# Patient Record
Sex: Male | Born: 1955 | Race: Black or African American | Hispanic: No | Smoking: Current every day smoker
Health system: Southern US, Community
[De-identification: ages and names within clinical notes are randomized; demographics above are authoritative.]

## PROBLEM LIST (undated history)

## (undated) ENCOUNTER — Emergency Department (HOSPITAL_COMMUNITY): Admission: EM

## (undated) DIAGNOSIS — F1011 Alcohol abuse, in remission: Secondary | ICD-10-CM

## (undated) DIAGNOSIS — I10 Essential (primary) hypertension: Secondary | ICD-10-CM

## (undated) DIAGNOSIS — R001 Bradycardia, unspecified: Secondary | ICD-10-CM

## (undated) DIAGNOSIS — Z87898 Personal history of other specified conditions: Secondary | ICD-10-CM

## (undated) DIAGNOSIS — R51 Headache: Secondary | ICD-10-CM

## (undated) DIAGNOSIS — R569 Unspecified convulsions: Secondary | ICD-10-CM

## (undated) DIAGNOSIS — I429 Cardiomyopathy, unspecified: Secondary | ICD-10-CM

## (undated) DIAGNOSIS — I471 Supraventricular tachycardia, unspecified: Secondary | ICD-10-CM

## (undated) DIAGNOSIS — G8929 Other chronic pain: Secondary | ICD-10-CM

## (undated) DIAGNOSIS — B182 Chronic viral hepatitis C: Secondary | ICD-10-CM

## (undated) HISTORY — DX: Supraventricular tachycardia, unspecified: I47.10

## (undated) HISTORY — DX: Supraventricular tachycardia: I47.1

## (undated) HISTORY — DX: Cardiomyopathy, unspecified: I42.9

## (undated) HISTORY — DX: Essential (primary) hypertension: I10

## (undated) HISTORY — DX: Bradycardia, unspecified: R00.1

## (undated) HISTORY — DX: Chronic viral hepatitis C: B18.2

## (undated) HISTORY — DX: Personal history of other specified conditions: Z87.898

## (undated) HISTORY — DX: Alcohol abuse, in remission: F10.11

---

## 2002-03-06 ENCOUNTER — Emergency Department (HOSPITAL_COMMUNITY): Admission: EM | Admit: 2002-03-06 | Discharge: 2002-03-06 | Payer: Self-pay | Admitting: Emergency Medicine

## 2002-03-07 ENCOUNTER — Encounter (HOSPITAL_COMMUNITY): Admission: RE | Admit: 2002-03-07 | Discharge: 2002-04-06 | Payer: Self-pay | Admitting: Family Medicine

## 2002-07-21 ENCOUNTER — Emergency Department (HOSPITAL_COMMUNITY): Admission: EM | Admit: 2002-07-21 | Discharge: 2002-07-21 | Payer: Self-pay | Admitting: Emergency Medicine

## 2002-11-13 ENCOUNTER — Encounter: Payer: Self-pay | Admitting: Emergency Medicine

## 2002-11-13 ENCOUNTER — Emergency Department (HOSPITAL_COMMUNITY): Admission: EM | Admit: 2002-11-13 | Discharge: 2002-11-13 | Payer: Self-pay | Admitting: Emergency Medicine

## 2003-12-19 ENCOUNTER — Emergency Department (HOSPITAL_COMMUNITY): Admission: EM | Admit: 2003-12-19 | Discharge: 2003-12-20 | Payer: Self-pay | Admitting: *Deleted

## 2005-02-20 ENCOUNTER — Emergency Department (HOSPITAL_COMMUNITY): Admission: EM | Admit: 2005-02-20 | Discharge: 2005-02-21 | Payer: Self-pay | Admitting: *Deleted

## 2005-11-10 ENCOUNTER — Emergency Department (HOSPITAL_COMMUNITY): Admission: EM | Admit: 2005-11-10 | Discharge: 2005-11-10 | Payer: Self-pay | Admitting: Emergency Medicine

## 2007-06-04 ENCOUNTER — Emergency Department (HOSPITAL_COMMUNITY): Admission: EM | Admit: 2007-06-04 | Discharge: 2007-06-04 | Payer: Self-pay | Admitting: Emergency Medicine

## 2009-03-14 ENCOUNTER — Encounter: Payer: Self-pay | Admitting: Gastroenterology

## 2009-03-28 DIAGNOSIS — I2510 Atherosclerotic heart disease of native coronary artery without angina pectoris: Secondary | ICD-10-CM | POA: Insufficient documentation

## 2009-03-28 DIAGNOSIS — I7389 Other specified peripheral vascular diseases: Secondary | ICD-10-CM

## 2009-03-28 DIAGNOSIS — K922 Gastrointestinal hemorrhage, unspecified: Secondary | ICD-10-CM | POA: Insufficient documentation

## 2009-03-28 DIAGNOSIS — I679 Cerebrovascular disease, unspecified: Secondary | ICD-10-CM

## 2009-03-28 DIAGNOSIS — E785 Hyperlipidemia, unspecified: Secondary | ICD-10-CM

## 2009-07-28 HISTORY — PX: OTHER SURGICAL HISTORY: SHX169

## 2009-08-25 ENCOUNTER — Emergency Department (HOSPITAL_COMMUNITY): Admission: EM | Admit: 2009-08-25 | Discharge: 2009-08-25 | Payer: Self-pay | Admitting: Emergency Medicine

## 2009-12-13 ENCOUNTER — Inpatient Hospital Stay (HOSPITAL_COMMUNITY)
Admission: EM | Admit: 2009-12-13 | Discharge: 2009-12-17 | Payer: Self-pay | Source: Home / Self Care | Admitting: Emergency Medicine

## 2009-12-14 ENCOUNTER — Ambulatory Visit: Payer: Self-pay | Admitting: Orthopedic Surgery

## 2009-12-15 ENCOUNTER — Encounter: Payer: Self-pay | Admitting: Orthopedic Surgery

## 2009-12-31 ENCOUNTER — Ambulatory Visit: Payer: Self-pay | Admitting: Orthopedic Surgery

## 2009-12-31 DIAGNOSIS — L02519 Cutaneous abscess of unspecified hand: Secondary | ICD-10-CM | POA: Insufficient documentation

## 2009-12-31 DIAGNOSIS — L03019 Cellulitis of unspecified finger: Secondary | ICD-10-CM

## 2010-01-18 ENCOUNTER — Encounter: Payer: Self-pay | Admitting: Orthopedic Surgery

## 2010-01-30 ENCOUNTER — Ambulatory Visit: Payer: Self-pay | Admitting: Orthopedic Surgery

## 2010-02-01 ENCOUNTER — Ambulatory Visit: Payer: Self-pay | Admitting: Orthopedic Surgery

## 2010-02-01 ENCOUNTER — Ambulatory Visit (HOSPITAL_COMMUNITY): Admission: RE | Admit: 2010-02-01 | Discharge: 2010-02-01 | Payer: Self-pay | Admitting: Orthopedic Surgery

## 2010-02-05 ENCOUNTER — Ambulatory Visit: Payer: Self-pay | Admitting: Orthopedic Surgery

## 2010-02-13 ENCOUNTER — Ambulatory Visit: Payer: Self-pay | Admitting: Orthopedic Surgery

## 2010-02-27 ENCOUNTER — Encounter: Payer: Self-pay | Admitting: Orthopedic Surgery

## 2010-05-28 ENCOUNTER — Encounter: Payer: Self-pay | Admitting: Orthopedic Surgery

## 2010-08-27 NOTE — Letter (Signed)
Summary: Medical record request Disab Determination  Medical record request Disab Determination   Imported By: Cammie Sickle 02/12/2010 18:04:26  _____________________________________________________________________  External Attachment:    Type:   Image     Comment:   External Document

## 2010-08-27 NOTE — Letter (Signed)
Summary: Surgery order RT thumb sched 02/01/10  Surgery order RT thumb sched 02/01/10   Imported By: Cammie Sickle 02/01/2010 08:54:41  _____________________________________________________________________  External Attachment:    Type:   Image     Comment:   External Document

## 2010-08-27 NOTE — Letter (Signed)
Summary: HOSP CONSULT RT hand  HOSP CONSULT RT hand   Imported By: Cammie Sickle 12/31/2009 09:37:04  _____________________________________________________________________  External Attachment:    Type:   Image     Comment:   External Document

## 2010-08-27 NOTE — Letter (Signed)
Summary: Medical record request Disab Determination  Medical record request Disab Determination   Imported By: Cammie Sickle 03/19/2010 12:10:15  _____________________________________________________________________  External Attachment:    Type:   Image     Comment:   External Document

## 2010-08-27 NOTE — Assessment & Plan Note (Signed)
Summary: hand is swollen, pain/ surgery pt/frs   Visit Type:  Follow-up Referring Provider:  ap fu  CC:  right hand.  History of Present Illness: This is a 55 year old right-hand-dominant male had incision and drainage at the bedside for presumed infection of his RIGHT thenar area back in May around the 20th.  I saw him in followup he is doing well.  He was doing well until last Saturday when he started having some swelling in the hand this has progressed to a area of abscess.  The patient gives a history of previous laceration several years ago which was treated elsewhere.  He would have infrequent but recurrent episodes of swelling which would go down without any treatment until he presented to the hospital here in May.  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  Alcohol Use - no Regular Exercise - no Drug Use - no  Risk Factors: Exercise: no (03/28/2009)  Risk Factors: Smoking Status: never (03/28/2009)  Review of Systems Constitutional:  Denies weight loss, weight gain, fever, chills, and fatigue. Cardiovascular:  Denies chest pain, palpitations, fainting, and murmurs. Respiratory:  Denies short of breath, wheezing, couch, tightness, pain on inspiration, and snoring . Gastrointestinal:   Denies heartburn, nausea, vomiting, diarrhea, constipation, and blood in your stools. Genitourinary:  Denies frequency, urgency, difficulty urinating, painful urination, flank pain, and bleeding in urine. Neurologic:  Denies numbness, tingling, unsteady gait, dizziness, tremors, and seizure. Musculoskeletal:  See HPI. Endocrine:  Denies excessive thirst, exessive urination, and heat or cold intolerance. Psychiatric:  Denies nervousness, depression, anxiety, and hallucinations. Skin:  Denies changes in the skin, poor healing, rash, itching, and redness. HEENT:  Denies blurred or double vision, eye pain, redness, and watering. Immunology:  Denies seasonal allergies, sinus problems, and allergic to bee stings. Hemoatologic:  Denies easy bleeding and brusing.  Physical Exam  Additional Exam:  Constitutional: vital signs see recorded values. General: normal development, nutrition, and grooming. No deformity. Body Habitus is small  CDV: Observation and palpation was normal   Lymph: palpation of the lymph nodes were normal  Skin: inspection and palpation of the skin revealed no abnormalities   Neuro: coordination: normal              DTR's normal              Sensation was normal   Psyche: Alert and oriented x 3. Mood was normal.  Affect: normal   MSK: Gait: normal   evaluation of the RIGHT thumb on inspection there is an area of about 2-1/2 cm which is swollen and tender and red overlying the previous traumatic incision with a firm nodular area near the incision.  There is soft tissue fluctuance.  Range of motion the IP joint and MP joint remained normal.  Flexion power at the IP joint is normal.  Metacarpophalangeal joint stability is normal.       Impression & Recommendations:  Problem #  1:  ABSCESS, FINGER (ICD-681.00) Assessment Deteriorated  right thumb   REC I/D DEEP CULTURE RIGHT thumb  Orders: Est. Patient Level IV (95284)  Patient Instructions: 1)  Informed consent  process: I have discussed the procedure with the patient. I have answered their questions. The risks of bleeding, infection, nerve and vascualr injury have been discussed. The diagnosis and reason for surgery have been explained. The patient demonstrates understanding of this discussion. Specific to this procedure risks include:  recurrent infection 2)  DOS 02/01/10 3)  I will call you with preop, take packet with you to [facility] short stay for the preop. 4)  Post op appt in our office on 02/05/10

## 2010-08-27 NOTE — Assessment & Plan Note (Signed)
Summary: HOSP FOL/UP CELLULITIS RT THUMB/BSF   Visit Type:  Follow-up Referring Provider:  ap fu  CC:  right thumb.  History of Present Illness: I saw Christopher Austin in the office today for a followup visit.  He is a 55 years old man with the complaint of: infection RIGHT thumb  He following up today after, I and D right thumb with course of IV ATBS 12/14/09.  Patient had abscess rt thumb.  Today is a 2 week recheck on right thumb after wet to dry dressings.  Bactrim was ATBS, finished 12/28/09  Doing better.  No meds taken for pain.  Allergies: No Known Drug Allergies  Physical Exam  Skin:  skin incision from the incision and drainage has healed there is no drainage there is no redness no tenderness and has full range of motion in the thumb   Impression & Recommendations:  Problem # 1:  ABSCESS, FINGER (ICD-681.00) Assessment Improved  Orders: Post-Op Check ([address])  Patient Instructions: 1)  Please schedule a follow-up appointment as needed.

## 2010-08-27 NOTE — Assessment & Plan Note (Signed)
Summary: POST OP 2/RT THUMB,SURG 02/01/10/SELF PAY/CAF   Visit Type:  Follow-up Referring Provider:  ap fu  CC:  post op 2 rt thumb.  History of Present Illness: I saw FREDRICK Saxer in the office today for a followup visit.  He is a 55 years old man with the complaint of:  post op 2 right thumb I and D, debridement, removal of foreign body, rt thumb.  DOS 02/01/10.  POD 12  Today for wound check, suture removal.  Meds: Vicodin for pain, has not taken in 2 days.  Has 3 more ATBs left, no problems.    Allergies: No Known Drug Allergies  Physical Exam  Extremities:  RIGHT hand evaluation suture line looks good no drainage no purulence no swelling in the thenar eminence normal thumb function     Impression & Recommendations:  Problem # 1:  ABSCESS, FINGER (ICD-681.00) Assessment Improved  Orders: Post-Op Check (56213)  Patient Instructions: 1)  Please schedule a follow-up appointment as needed. 2)  finish the last few pills of the antibiotic  3)  the paper strips on your hand will fall of by themselves in a week  4)  if they don't, you can pull them off in a week

## 2010-08-27 NOTE — Assessment & Plan Note (Signed)
Summary: POST OP 1/RT THUMB/SURG 02/01/10/SELF PAY/CAF   Visit Type:  Follow-up Referring Provider:  ap fu  CC:  post op .  History of Present Illness: I saw Christopher Austin in the office today for a followup visit.  He is a 55 years old man with the complaint of:  post op 1 right thumb I and D, debridement, removal of foreign body, rt thumb.  DOS 02/01/10.  POD 4.  Today for incision check.  Meds: Vicodin for pain, 1 tablet every 4 hrs.  Doing ok.  Wound Culture for review also.  cultures negative   Allergies: No Known Drug Allergies  Physical Exam  Skin:  wound clean    Other Orders: Post-Op Check ([address])  Patient Instructions: 1)  return for suture removal in 8 days

## 2010-08-27 NOTE — Letter (Signed)
Summary: Medical records received from Disability Determination Services   Medical records received from [facility]ability Determination [facility]   Imported By: Jacklynn Ganong 05/28/2010 14:34:09  _____________________________________________________________________  External Attachment:    Type:   Image     Comment:   External Document

## 2010-10-13 LAB — WOUND CULTURE: Culture: NO GROWTH

## 2010-10-13 LAB — ANAEROBIC CULTURE

## 2010-10-13 LAB — SURGICAL PCR SCREEN: MRSA, PCR: NEGATIVE

## 2010-10-14 LAB — BASIC METABOLIC PANEL
CO2: 23 mEq/L (ref 19–32)
CO2: 25 mEq/L (ref 19–32)
Calcium: 9.7 mg/dL (ref 8.4–10.5)
Chloride: 100 mEq/L (ref 96–112)
GFR calc Af Amer: >60 mL/min (ref >60)
GFR calc Af Amer: >60 mL/min (ref >60)
GFR calc non Af Amer: >60 mL/min (ref >60)
Glucose, Bld: 91 mg/dL (ref 70–99)
Potassium: 3.7 mEq/L (ref 3.5–5.1)
Potassium: 4.3 mEq/L (ref 3.5–5.1)
Sodium: 134 mEq/L — ABNORMAL LOW (ref 135–145)
Sodium: 135 mEq/L (ref 135–145)

## 2010-10-14 LAB — LIPID PANEL
HDL: 101 mg/dL (ref >39)
Total CHOL/HDL Ratio: 1.7 RATIO
Triglycerides: 44 mg/dL (ref <150)

## 2010-10-14 LAB — CULTURE, ROUTINE-ABSCESS: Culture: NO GROWTH

## 2010-10-14 LAB — RAPID URINE DRUG SCREEN, HOSP PERFORMED
Barbiturates: NOT DETECTED
Benzodiazepines: NOT DETECTED

## 2010-10-14 LAB — CBC
HCT: 40.9 % (ref 39.0–52.0)
HCT: 41.6 % (ref 39.0–52.0)
Hemoglobin: 14.1 g/dL (ref 13.0–17.0)
Hemoglobin: 14.4 g/dL (ref 13.0–17.0)
MCHC: 34.7 g/dL (ref 30.0–36.0)
MCV: 100.5 fL — ABNORMAL HIGH (ref 78.0–100.0)
RBC: 4.06 MIL/uL — ABNORMAL LOW (ref 4.22–5.81)
RBC: 4.14 MIL/uL — ABNORMAL LOW (ref 4.22–5.81)
RDW: 12.9 % (ref 11.5–15.5)
WBC: 7.9 10*3/uL (ref 4.0–10.5)

## 2010-10-14 LAB — DIFFERENTIAL
Basophils Relative: 8 % — ABNORMAL HIGH (ref 0–1)
Eosinophils Absolute: 0.1 10*3/uL (ref 0.0–0.7)
Eosinophils Relative: 2 % (ref 0–5)
Lymphocytes Relative: 31 % (ref 12–46)
Lymphs Abs: 2.3 10*3/uL (ref 0.7–4.0)
Monocytes Absolute: 0.9 10*3/uL (ref 0.1–1.0)
Monocytes Absolute: 0.9 10*3/uL (ref 0.1–1.0)
Monocytes Relative: 11 % (ref 3–12)
Monocytes Relative: 14 % — ABNORMAL HIGH (ref 3–12)
Neutro Abs: 3.5 10*3/uL (ref 1.7–7.7)

## 2010-10-14 LAB — VANCOMYCIN, TROUGH: Vancomycin Tr: 7.3 ug/mL — ABNORMAL LOW (ref 10.0–20.0)

## 2010-10-14 LAB — BRAIN NATRIURETIC PEPTIDE: Pro B Natriuretic peptide (BNP): <30 pg/mL (ref 0.0–100.0)

## 2011-05-06 LAB — RAPID URINE DRUG SCREEN, HOSP PERFORMED
Amphetamines: NOT DETECTED
Benzodiazepines: NOT DETECTED
Cocaine: NOT DETECTED
Tetrahydrocannabinol: NOT DETECTED

## 2011-05-06 LAB — ETHANOL: Alcohol, Ethyl (B): 150 — ABNORMAL HIGH

## 2011-05-06 LAB — URINALYSIS, ROUTINE W REFLEX MICROSCOPIC
Glucose, UA: NEGATIVE
Ketones, ur: NEGATIVE
Nitrite: NEGATIVE
Protein, ur: NEGATIVE
Urobilinogen, UA: 0.2

## 2011-05-06 LAB — DIFFERENTIAL
Basophils Absolute: 0
Eosinophils Absolute: 0
Eosinophils Relative: 1
Lymphocytes Relative: 42
Neutrophils Relative %: 47

## 2011-05-06 LAB — BASIC METABOLIC PANEL
BUN: 3 — ABNORMAL LOW
Creatinine, Ser: 0.8
GFR calc non Af Amer: >60
Glucose, Bld: 86
Potassium: 3.9

## 2011-05-06 LAB — CBC
HCT: 40.8
Platelets: 242
RDW: 14.5 — ABNORMAL HIGH

## 2011-11-05 ENCOUNTER — Emergency Department (HOSPITAL_COMMUNITY)
Admission: EM | Admit: 2011-11-05 | Discharge: 2011-11-06 | Disposition: A | Discharge status: Home / Self Care | Payer: Self-pay | Attending: Emergency Medicine | Admitting: Emergency Medicine

## 2011-11-05 ENCOUNTER — Emergency Department (HOSPITAL_COMMUNITY): Payer: Self-pay

## 2011-11-05 DIAGNOSIS — W010XXA Fall on same level from slipping, tripping and stumbling without subsequent striking against object, initial encounter: Secondary | ICD-10-CM | POA: Insufficient documentation

## 2011-11-05 DIAGNOSIS — R079 Chest pain, unspecified: Secondary | ICD-10-CM | POA: Insufficient documentation

## 2011-11-05 DIAGNOSIS — S2249XA Multiple fractures of ribs, unspecified side, initial encounter for closed fracture: Secondary | ICD-10-CM | POA: Insufficient documentation

## 2011-11-05 DIAGNOSIS — S2241XA Multiple fractures of ribs, right side, initial encounter for closed fracture: Secondary | ICD-10-CM

## 2011-11-05 NOTE — ED Notes (Signed)
States he fell 2 days ago and has pain in right lateral rib cage

## 2011-11-06 MED ORDER — IBUPROFEN 800 MG PO TABS
800.0000 mg | ORAL_TABLET | Freq: Once | ORAL | Status: AC
  Administered 2011-11-06: 800 mg via ORAL
  Filled 2011-11-06: qty 1

## 2011-11-06 MED ORDER — HYDROCODONE-ACETAMINOPHEN 5-325 MG PO TABS
1.0000 | ORAL_TABLET | Freq: Once | ORAL | Status: AC
  Administered 2011-11-06: 1 via ORAL
  Filled 2011-11-06: qty 1

## 2011-11-06 MED ORDER — HYDROCODONE-ACETAMINOPHEN 5-325 MG PO TABS: 1.0000 | ORAL_TABLET | Freq: Four times a day (QID) | ORAL | Status: AC | PRN

## 2011-11-06 NOTE — Discharge Instructions (Signed)
Rib Fracture The ribs are like a cage that goes around your upper chest. The ribs protect your lungs. Your ribs move when you breathe, so it hurts if a rib is broken. HOME CARE  Avoid activities that cause pain to the injured area. Protect your injured area.   Eat a normal, healthy diet.   Drink enough fluids to keep your pee (urine) clear or pale yellow.   Take deep breaths many times a day. Cough many times a day while hugging a pillow.   Do not wear a rib belt or binder on the chest unless told by your doctor. Rib belts or binders do not allow you to breathe deeply.   Only take medicine as told by your doctor.  GET HELP RIGHT AWAY IF:   You have a fever.   You cannot stop coughing or cough up thick or bloody spit (mucus).   You have trouble breathing.   You feel sick to your stomach (nauseous), throw up (vomit), or have belly (abdominal) pain.   Your pain gets worse and medicine does not help.  MAKE SURE YOU:   Understand these instructions.   Will watch this condition.   Will get help right away if you are not doing well or get worse.  Document Released: 04/22/2008 Document Revised: 07/03/2011 Document Reviewed: 04/22/2008 [facility] Patient Information 2012 [place], [place].Cryotherapy Cryotherapy means treatment with cold. Ice or gel packs can be used to reduce both pain and swelling. Ice is the most helpful within the first 24 to 48 hours after an injury or flareup from overusing a muscle or joint. Sprains, strains, spasms, burning pain, shooting pain, and aches can all be eased with ice. Ice can also be used when recovering from surgery. Ice is effective, has very few side effects, and is safe for most people to use. PRECAUTIONS  Ice is not a safe treatment option for people with:  Raynaud's phenomenon. This is a condition affecting small blood vessels in the extremities. Exposure to cold may cause your problems to return.   Cold hypersensitivity. There are many forms of  cold hypersensitivity, including:   Cold urticaria. Red, itchy hives appear on the skin when the tissues begin to warm after being iced.   Cold erythema. This is a red, itchy rash caused by exposure to cold.   Cold hemoglobinuria. Red blood cells break down when the tissues begin to warm after being iced. The hemoglobin that carry oxygen are passed into the urine because they cannot combine with blood proteins fast enough.   Numbness or altered sensitivity in the area being iced.  If you have any of the following conditions, do not use ice until you have discussed cryotherapy with your caregiver:  Heart conditions, such as arrhythmia, angina, or chronic heart disease.   High blood pressure.   Healing wounds or open skin in the area being iced.   Current infections.   Rheumatoid arthritis.   Poor circulation.   Diabetes.  Ice slows the blood flow in the region it is applied. This is beneficial when trying to stop inflamed tissues from spreading irritating chemicals to surrounding tissues. However, if you expose your skin to cold temperatures for too long or without the proper protection, you can damage your skin or nerves. Watch for signs of skin damage due to cold. HOME CARE INSTRUCTIONS Follow these tips to use ice and cold packs safely.  Place a dry or damp towel between the ice and skin. A damp towel will  cool the skin more quickly, so you may need to shorten the time that the ice is used.   For a more rapid response, add gentle compression to the ice.   Ice for no more than 10 to 20 minutes at a time. The bonier the area you are icing, the less time it will take to get the benefits of ice.   Check your skin after 5 minutes to make sure there are no signs of a poor response to cold or skin damage.   Rest 20 minutes or more in between uses.   Once your skin is numb, you can end your treatment. You can test numbness by very lightly touching your skin. The touch should be so  light that you do not see the skin dimple from the pressure of your fingertip. When using ice, most people will feel these normal sensations in this order: cold, burning, aching, and numbness.   Do not use ice on someone who cannot communicate their responses to pain, such as small children or people with dementia.  HOW TO MAKE AN ICE PACK Ice packs are the most common way to use ice therapy. Other methods include ice massage, ice baths, and cryo-sprays. Muscle creams that cause a cold, tingly feeling do not offer the same benefits that ice offers and should not be used as a substitute unless recommended by your caregiver. To make an ice pack, do one of the following:  Place crushed ice or a bag of frozen vegetables in a sealable plastic bag. Squeeze out the excess air. Place this bag inside another plastic bag. Slide the bag into a pillowcase or place a damp towel between your skin and the bag.   Mix 3 parts water with 1 part rubbing alcohol. Freeze the mixture in a sealable plastic bag. When you remove the mixture from the freezer, it will be slushy. Squeeze out the excess air. Place this bag inside another plastic bag. Slide the bag into a pillowcase or place a damp towel between your skin and the bag.  SEEK MEDICAL CARE IF:  You develop white spots on your skin. This may give the skin a blotchy (mottled) appearance.   Your skin turns blue or pale.   Your skin becomes waxy or hard.   Your swelling gets worse.  MAKE SURE YOU:   Understand these instructions.   Will watch your condition.   Will get help right away if you are not doing well or get worse.  Document Released: 03/10/2011 Document Revised: 07/03/2011 Document Reviewed: 03/10/2011 [facility] Patient Information 2012 [place], [place].   Take the pain medicine as directed.  Take ibuprofen up to 800 mg every 8 hrs with food.  Wear the rib belt for comfort.  Use the incentive spirometer every 2 hrs while awake.  Apply ice several  times daily.  Return to ED if you develop a fever or cough.

## 2011-11-06 NOTE — ED Notes (Signed)
A&ox4; in no distress; rib belt placed to right ribs. RT at bedside to teach incentive spirometry.

## 2011-11-06 NOTE — ED Notes (Signed)
C/o right rib pain after falling on a pile of wood one week ago; reports no improvement in pain.

## 2011-11-06 NOTE — ED Provider Notes (Signed)
History     CSN: [id number]  Arrival date & time 11/05/11  2023   First MD Initiated Contact with Patient 11/06/11 0017      Chief Complaint  Patient presents with  . Rib Injury    (Consider location/radiation/quality/duration/timing/severity/associated sxs/prior treatment) HPI Comments: Pt was cutting wood last week.  He was carrying a log and tripped and fell on it hurting R ribs.  The history is provided by the patient. No language interpreter was used.    No past medical history on file.  No past surgical history on file.  No family history on file.  History  Substance Use Topics  . Smoking status: Not on file  . Smokeless tobacco: Not on file  . Alcohol Use: Not on file      Review of Systems  Respiratory: Negative for cough, shortness of breath, wheezing and stridor.   Cardiovascular: Positive for chest pain.  All other systems reviewed and are negative.    Allergies  Review of patient's allergies indicates not on file.  Home Medications   Current Outpatient Rx  Name Route Sig Dispense Refill  . HYDROCODONE-ACETAMINOPHEN 5-325 MG PO TABS Oral Take 1 tablet by mouth every 6 (six) hours as needed for pain. 20 tablet 0    BP 121/79  Pulse 75  Temp(Src) 98.2 F (36.8 C) (Oral)  Resp 16  Ht 5\' 4"  (1.626 m)  Wt 112 lb (50.803 kg)  BMI 19.22 kg/m2  SpO2 95%  Physical Exam  Nursing note and vitals reviewed. Constitutional: He is oriented to person, place, and time. He appears well-developed and well-nourished.  HENT:  Head: Normocephalic and atraumatic.  Eyes: EOM are normal.  Neck: Normal range of motion.  Cardiovascular: Normal rate, regular rhythm, normal heart sounds and intact distal pulses.   Pulmonary/Chest: Effort normal and breath sounds normal. No respiratory distress. He has no decreased breath sounds. He has no wheezes. He has no rhonchi. He has no rales. He exhibits tenderness.    Abdominal: Soft. He exhibits no distension. There is  no tenderness.  Musculoskeletal: Normal range of motion.  Neurological: He is alert and oriented to person, place, and time.  Skin: Skin is warm and dry.  Psychiatric: He has a normal mood and affect. Judgment normal.    ED Course  Procedures (including critical care time)  Labs Reviewed - No data to display Dg Ribs Unilateral W/chest Right  11/05/2011  *RADIOLOGY REPORT*  Clinical Data: Status post fall; hit right anterior ribs.  RIGHT RIBS AND CHEST - 3+ VIEW  Comparison: None.  Findings: There are mildly displaced fracture through the right lateral seventh through ninth ribs, with a small associated soft tissue hematoma.  The lungs are well-aerated and clear.  There is no evidence of focal opacification, pleural effusion or pneumothorax.  The cardiomediastinal silhouette is within normal limits.  No additional osseous abnormalities are seen.  IMPRESSION:  1.  Mildly displaced fractures through the right lateral seventh through ninth ribs, with a small associated soft tissue hematoma. 2.  No acute cardiopulmonary process seen.  Original Report Authenticated By: Tonia Ghent, M.D.     1. Multiple fractures of ribs of right side       MDM  rx hydrocodone, 20 Ibuprofen  TID Rib belt Ice Incentive spirometer q 2 hrs Return if fever or cough        Worthy Rancher, PA 11/06/11 0038  Worthy Rancher, PA 11/06/11 0041

## 2011-11-12 NOTE — ED Provider Notes (Signed)
Medical screening examination/treatment/procedure(s) were performed by non-physician practitioner and as supervising physician I was immediately available for consultation/collaboration.  Ahmed Inniss S. Kyliegh Jester, MD 11/12/11 1114 

## 2012-04-26 ENCOUNTER — Other Ambulatory Visit: Payer: Self-pay

## 2012-04-26 DIAGNOSIS — Z139 Encounter for screening, unspecified: Secondary | ICD-10-CM

## 2012-04-27 ENCOUNTER — Telehealth: Payer: Self-pay

## 2012-04-27 MED ORDER — PEG-KCL-NACL-NASULF-NA ASC-C 100 G PO SOLR: 1.0000 | ORAL | Status: DC

## 2012-04-27 NOTE — Telephone Encounter (Signed)
OK to schedule

## 2012-04-27 NOTE — Telephone Encounter (Signed)
Gastroenterology Pre-Procedure Form    Request Date: 04/26/2012               Requesting Physician: Dr. Felecia Shelling     PATIENT INFORMATION:  Christopher Austin is a 56 y.o., male (DOB=10-04-1955).  PROCEDURE: Procedure(s) requested: colonoscopy Procedure Reason: screening for colon cancer  PATIENT REVIEW QUESTIONS: The patient reports the following:   1. Diabetes Melitis: no 2. Joint replacements in the past 12 months: no 3. Major health problems in the past 3 months: no 4. Has an artificial valve or MVP:no 5. Has been advised in past to take antibiotics in advance of a procedure like teeth cleaning: no}    MEDICATIONS & ALLERGIES:    Patient reports the following regarding taking any blood thinners:   Plavix? no Aspirin?no Coumadin?  no  Patient confirms/reports the following medications:  Current Outpatient Prescriptions  Medication Sig Dispense Refill  . amLODipine (NORVASC) 5 MG tablet Take 5 mg by mouth daily.      . folic acid (FOLVITE) 1 MG tablet Take 1 mg by mouth daily.      . naproxen (NAPROSYN) 500 MG tablet Take 500 mg by mouth 2 (two) times daily with a meal. Takes only as needed      . nicotine (NICODERM CQ - DOSED IN MG/24 HOURS) 21 mg/24hr patch Place 1 patch onto the skin daily.      Marland Kitchen thiamine (VITAMIN B-1) 100 MG tablet Take 100 mg by mouth daily.        Patient confirms/reports the following allergies:  No Known Allergies  Patient is appropriate to schedule for requested procedure(s): yes  AUTHORIZATION INFORMATION Primary Insurance:  ID #:   Group #:  Pre-Cert / Auth required:  Pre-Cert / Auth #:   Secondary Insurance:   ID #:   Group #:  Pre-Cert / Auth required:  Pre-Cert / Auth #:  No orders of the defined types were placed in this encounter.    SCHEDULE INFORMATION: Procedure has been scheduled as follows:  Date: 05/19/2012          Time: 9:30 AM  Location: [facility] Stay  This Gastroenterology Pre-Precedure Form is being  routed to the following provider(s) for review: R. Roetta Sessions, MD

## 2012-04-27 NOTE — Telephone Encounter (Signed)
Rx sent to [facility]. Instructions mailed to pt.  

## 2012-05-19 ENCOUNTER — Emergency Department (HOSPITAL_COMMUNITY): Payer: Medicaid Other

## 2012-05-19 ENCOUNTER — Encounter (HOSPITAL_COMMUNITY)
Admission: RE | Disposition: A | Discharge status: Home / Self Care | Payer: Self-pay | Source: Ambulatory Visit | Attending: Internal Medicine

## 2012-05-19 ENCOUNTER — Other Ambulatory Visit: Payer: Self-pay

## 2012-05-19 ENCOUNTER — Ambulatory Visit (HOSPITAL_COMMUNITY)
Admission: RE | Admit: 2012-05-19 | Discharge: 2012-05-19 | Disposition: A | Discharge status: Home / Self Care | Payer: Medicaid Other | Source: Ambulatory Visit | Attending: Internal Medicine | Admitting: Internal Medicine

## 2012-05-19 ENCOUNTER — Encounter (HOSPITAL_COMMUNITY): Payer: Self-pay | Admitting: *Deleted

## 2012-05-19 ENCOUNTER — Emergency Department (HOSPITAL_COMMUNITY)
Admission: EM | Admit: 2012-05-19 | Discharge: 2012-05-19 | Disposition: A | Discharge status: Home / Self Care | Payer: Medicaid Other | Source: Home / Self Care | Attending: Emergency Medicine | Admitting: Emergency Medicine

## 2012-05-19 ENCOUNTER — Encounter (HOSPITAL_COMMUNITY): Payer: Self-pay

## 2012-05-19 DIAGNOSIS — I10 Essential (primary) hypertension: Secondary | ICD-10-CM | POA: Insufficient documentation

## 2012-05-19 DIAGNOSIS — Z87891 Personal history of nicotine dependence: Secondary | ICD-10-CM | POA: Insufficient documentation

## 2012-05-19 DIAGNOSIS — R001 Bradycardia, unspecified: Secondary | ICD-10-CM

## 2012-05-19 DIAGNOSIS — Z1211 Encounter for screening for malignant neoplasm of colon: Secondary | ICD-10-CM

## 2012-05-19 DIAGNOSIS — I498 Other specified cardiac arrhythmias: Secondary | ICD-10-CM | POA: Insufficient documentation

## 2012-05-19 DIAGNOSIS — Z139 Encounter for screening, unspecified: Secondary | ICD-10-CM

## 2012-05-19 DIAGNOSIS — K648 Other hemorrhoids: Secondary | ICD-10-CM

## 2012-05-19 DIAGNOSIS — Z79899 Other long term (current) drug therapy: Secondary | ICD-10-CM | POA: Insufficient documentation

## 2012-05-19 DIAGNOSIS — K6389 Other specified diseases of intestine: Secondary | ICD-10-CM

## 2012-05-19 DIAGNOSIS — I369 Nonrheumatic tricuspid valve disorder, unspecified: Secondary | ICD-10-CM

## 2012-05-19 HISTORY — PX: COLONOSCOPY: SHX5424

## 2012-05-19 LAB — URINALYSIS, ROUTINE W REFLEX MICROSCOPIC
Bilirubin Urine: NEGATIVE
Glucose, UA: NEGATIVE mg/dL
Hgb urine dipstick: NEGATIVE
Ketones, ur: NEGATIVE mg/dL
Protein, ur: NEGATIVE mg/dL

## 2012-05-19 LAB — BASIC METABOLIC PANEL
BUN: 6 mg/dL (ref 6–23)
GFR calc non Af Amer: >90 mL/min (ref >90)
Glucose, Bld: 114 mg/dL — ABNORMAL HIGH (ref 70–99)
Potassium: 3.8 mEq/L (ref 3.5–5.1)

## 2012-05-19 LAB — CBC WITH DIFFERENTIAL/PLATELET
Basophils Relative: 1 % (ref 0–1)
Eosinophils Absolute: 0.1 10*3/uL (ref 0.0–0.7)
Eosinophils Relative: 2 % (ref 0–5)
HCT: 35.6 % — ABNORMAL LOW (ref 39.0–52.0)
Hemoglobin: 12.1 g/dL — ABNORMAL LOW (ref 13.0–17.0)
MCH: 32.8 pg (ref 26.0–34.0)
MCHC: 34 g/dL (ref 30.0–36.0)
MCV: 96.5 fL (ref 78.0–100.0)
Monocytes Absolute: 0.6 10*3/uL (ref 0.1–1.0)
Monocytes Relative: 13 % — ABNORMAL HIGH (ref 3–12)
Neutrophils Relative %: 39 % — ABNORMAL LOW (ref 43–77)

## 2012-05-19 LAB — MAGNESIUM: Magnesium: 1.8 mg/dL (ref 1.5–2.5)

## 2012-05-19 LAB — TSH: TSH: 2.268 u[IU]/mL (ref 0.350–4.500)

## 2012-05-19 SURGERY — COLONOSCOPY
Anesthesia: Moderate Sedation

## 2012-05-19 MED ORDER — MEPERIDINE HCL 100 MG/ML IJ SOLN
INTRAMUSCULAR | Status: DC | PRN
  Administered 2012-05-19: 50 mg via INTRAVENOUS
  Administered 2012-05-19: 25 mg via INTRAVENOUS

## 2012-05-19 MED ORDER — MIDAZOLAM HCL 5 MG/5ML IJ SOLN
INTRAMUSCULAR | Status: AC
  Filled 2012-05-19: qty 10

## 2012-05-19 MED ORDER — SODIUM CHLORIDE 0.45 % IV SOLN
INTRAVENOUS | Status: DC
  Administered 2012-05-19: 09:00:00 via INTRAVENOUS

## 2012-05-19 MED ORDER — MIDAZOLAM HCL 5 MG/5ML IJ SOLN
INTRAMUSCULAR | Status: DC | PRN
  Administered 2012-05-19 (×2): 2 mg via INTRAVENOUS

## 2012-05-19 MED ORDER — MEPERIDINE HCL 100 MG/ML IJ SOLN
INTRAMUSCULAR | Status: AC
  Filled 2012-05-19: qty 1

## 2012-05-19 MED ORDER — STERILE WATER FOR IRRIGATION IR SOLN
Status: DC | PRN
  Administered 2012-05-19: 10:00:00

## 2012-05-19 MED ORDER — SODIUM CHLORIDE 0.9 % IV SOLN
INTRAVENOUS | Status: DC
  Administered 2012-05-19: 12:00:00 via INTRAVENOUS

## 2012-05-19 NOTE — Op Note (Signed)
[facility] [address] [place], [address]   COLONOSCOPY PROCEDURE REPORT  PATIENT: Shah, Insley  MR#:         [id number] BIRTHDATE: 01/28/1956 , 56  yrs. old GENDER: Male ENDOSCOPIST: R.  Roetta Sessions, MD FACP FACG REFERRED BY:  Glenice Laine, M.D. PROCEDURE DATE:  05/19/2012 PROCEDURE:     Colonoscopy with biopsy  INDICATIONS: average risk colorectal cancer screening  INFORMED CONSENT:  The risks, benefits, alternatives and imponderables including but not limited to bleeding, perforation as well as the possibility of a missed lesion have been reviewed.  The potential for biopsy, lesion removal, etc. have also been discussed.  Questions have been answered.  All parties agreeable. Please see the history and physical in the medical record for more information.  MEDICATIONS: Versed 4 mg IV and Demerol 75 mg IV in divided dose  DESCRIPTION OF PROCEDURE:  After a digital rectal exam was performed, the EC-[id number] ([id number])  colonoscope was advanced from the anus through the rectum and colon to the area of the cecum, ileocecal valve and appendiceal orifice.  The cecum was deeply intubated.  These structures were well-seen and photographed for the record.  From the level of the cecum and ileocecal valve, the scope was slowly and cautiously withdrawn.  The mucosal surfaces were carefully surveyed utilizing scope tip deflection to facilitate fold flattening as needed.  The scope was pulled down into the rectum where a thorough examination including retroflexion was performed.    FINDINGS:  Adequate preparation. Internal hemorrhoids; otherwise, normal rectum. 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 Area of what appeared to be healing ulcer of the distal side ileocecal valve. I was unable to intubate the terminal ileum. The remainder of the colonic mucosa appeared normal.  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  RECOMMENDATIONS: Followup on pathology. It is notable patient's was bradycardic in the upper 30s and lower 40s before, during and after procedure. We will obtain a 12-lead EKG to evaluate this further.   _______________________________ eSigned:  R. Roetta Sessions, MD FACP [facility] 05/19/2012 10:00 AM   CC:

## 2012-05-19 NOTE — Progress Notes (Signed)
Ekg done per Dr. Luvenia Starch order.  Dr Felecia Shelling office called to have appointment scheduled.  Ekg faxed to Dr. Letitia Neri office.  Dr. Felecia Shelling instructed patient to go to ED.  Pt. Transferred to ED per w/c with family.

## 2012-05-19 NOTE — Progress Notes (Signed)
*  PRELIMINARY RESULTS* Echocardiogram 2D Echocardiogram has been performed.  Christopher Austin 05/19/2012, 2:09 PM

## 2012-05-19 NOTE — Progress Notes (Signed)
12-lead EKG following colonoscopy today demonstrates significant sinus bradycardia without acute changes. I have recommended that Christopher Austin followup with Dr. Felecia Shelling for further evaluation as he deems appropriate

## 2012-05-19 NOTE — H&P (Signed)
Primary Care Physician:  Avon Gully, MD Primary Gastroenterologist:  Dr. Jena Gauss  Pre-Procedure History & Physical: HPI:  LEDARRIUS Austin is a 56 y.o. male is here for a screening colonoscopy. No bowel symptoms. No family history colon polyps or colon cancer. Patient gives a vague history of having a colonoscopy some 25 years ago-no further details  Past Medical History  Diagnosis Date  . Hypertension     Past Surgical History  Procedure Date  . Hand surgery 2012    right hand    Prior to Admission medications   Medication Sig Start Date End Date Taking? Authorizing Provider  amLODipine (NORVASC) 5 MG tablet Take 5 mg by mouth daily.   Yes Historical Provider, MD  folic acid (FOLVITE) 1 MG tablet Take 1 mg by mouth daily.   Yes Historical Provider, MD  naproxen (NAPROSYN) 500 MG tablet Take 500 mg by mouth 2 (two) times daily as needed. For pain   Yes Historical Provider, MD  nicotine (NICODERM CQ - DOSED IN MG/24 HOURS) 21 mg/24hr patch Place 1 patch onto the skin daily.   Yes Historical Provider, MD  peg 3350 powder (MOVIPREP) 100 G SOLR Take 1 kit (100 g total) by mouth as directed. 04/27/12  Yes Corbin Ade, MD  thiamine (VITAMIN B-1) 100 MG tablet Take 100 mg by mouth daily.   Yes Historical Provider, MD    Allergies as of 04/26/2012  . (No Known Allergies)    History reviewed. No pertinent family history.  History   Social History  . Marital Status: Legally Separated    Spouse Name: N/A    Number of Children: N/A  . Years of Education: N/A   Occupational History  . Not on file.   Social History Main Topics  . Smoking status: Former Games developer  . Smokeless tobacco: Not on file  . Alcohol Use: No     quit 2 months ago  . Drug Use: No  . Sexually Active:    Other Topics Concern  . Not on file   Social History Narrative  . No narrative on file    Review of Systems: See HPI, otherwise negative ROS  Physical Exam: BP 136/70  Pulse 41  Temp 97.7 F  (36.5 C) (Oral)  Resp 21  Ht 5\' 4"  (1.626 m)  Wt 115 lb (52.164 kg)  BMI 19.74 kg/m2  SpO2 98% General:   Alert,  Well-developed, well-nourished, pleasant and cooperative in NAD Head:  Normocephalic and atraumatic. Eyes:  Sclera clear, no icterus.   Conjunctiva pink. Ears:  Normal auditory acuity. Nose:  No deformity, discharge,  or lesions. Mouth:  No deformity or lesions, dentition normal. Neck:  Supple; no masses or thyromegaly. Lungs:  Clear throughout to auscultation.   No wheezes, crackles, or rhonchi. No acute distress. Heart:  Regular rate and rhythm; no murmurs, clicks, rubs,  or gallops. Abdomen:  Soft, nontender and nondistended. No masses, hepatosplenomegaly or hernias noted. Normal bowel sounds, without guarding, and without rebound.   Msk:  Symmetrical without gross deformities. Normal posture. Pulses:  Normal pulses noted. Extremities:  Without clubbing or edema. Neurologic:  Alert and  oriented x4;  grossly normal neurologically. Skin:  Intact without significant lesions or rashes. Cervical Nodes:  No significant cervical adenopathy. Psych:  Alert and cooperative. Normal mood and affect.  Impression/Plan: Christopher Austin is now here to undergo a screening colonoscopy.  Risks, benefits, limitations, imponderables and alternatives regarding colonoscopy have been reviewed with the patient. Questions have  been answered. All parties agreeable.

## 2012-05-19 NOTE — ED Notes (Signed)
Pt had gone to short stay for colonoscopy, ekg done and pt had pulse of 42  Denies cp or sob

## 2012-05-19 NOTE — ED Notes (Signed)
Pt denies any sob, chest pain or dizziness at this time.

## 2012-05-19 NOTE — ED Notes (Signed)
Patient ambulated in the hallway around the nurses station. O2 stayed at 88% HR at 110. MD aware.

## 2012-05-19 NOTE — ED Provider Notes (Addendum)
History     CSN: [id number]  Arrival date & time 05/19/12  1111   First MD Initiated Contact with Patient 05/19/12 1125      Chief Complaint  Patient presents with  . Bradycardia     HPI Pt was seen at 1125.  Per pt and family, c/o unknown onset and persistence of constant "low heart rate" that was noticed while he was at short stay for a colonoscopy today PTA.  Pt's HR was noted to be "in the 40's" before, during, and after the procedure.  Pt denies any complaints.  Denies CP/palpitations, no SOB/cough, no fevers, no back pain, no abd pain, no lightheadedness/near syncope.    Past Medical History  Diagnosis Date  . Hypertension     Past Surgical History  Procedure Date  . Hand surgery 2012    right hand     History  Substance Use Topics  . Smoking status: Former Games developer  . Smokeless tobacco: Not on file  . Alcohol Use: No     quit 2 months ago      Review of Systems ROS: Statement: All systems negative except as marked or noted in the HPI; Constitutional: Negative for fever and chills. ; ; Eyes: Negative for eye pain, redness and discharge. ; ; ENMT: Negative for ear pain, hoarseness, nasal congestion, sinus pressure and sore throat. ; ; Cardiovascular: Negative for chest pain, palpitations, diaphoresis, dyspnea and peripheral edema. ; ; Respiratory: Negative for cough, wheezing and stridor. ; ; Gastrointestinal: Negative for nausea, vomiting, diarrhea, abdominal pain, blood in stool, hematemesis, jaundice and rectal bleeding. . ; ; Genitourinary: Negative for dysuria, flank pain and hematuria. ; ; Musculoskeletal: Negative for back pain and neck pain. Negative for swelling and trauma.; ; Skin: Negative for pruritus, rash, abrasions, blisters, bruising and skin lesion.; ; Neuro: Negative for headache, lightheadedness and neck stiffness. Negative for weakness, altered level of consciousness , altered mental status, extremity weakness, paresthesias, involuntary movement,  seizure and syncope.       Allergies  Review of patient's allergies indicates no known allergies.  Home Medications   Current Outpatient Rx  Name Route Sig Dispense Refill  . AMLODIPINE BESYLATE 5 MG PO TABS Oral Take 5 mg by mouth daily.    Marland Kitchen FOLIC ACID 1 MG PO TABS Oral Take 1 mg by mouth daily.    Marland Kitchen NAPROXEN 500 MG PO TABS Oral Take 500 mg by mouth 2 (two) times daily as needed. For pain    . PEG-KCL-NACL-NASULF-NA ASC-C 100 G PO SOLR Oral Take 1 kit (100 g total) by mouth as directed. 1 kit 0  . VITAMIN B-1 100 MG PO TABS Oral Take 100 mg by mouth daily.      BP 146/72  Pulse 36  Resp 18  Ht 5\' 4"  (1.626 m)  Wt 115 lb (52.164 kg)  BMI 19.74 kg/m2  SpO2 99%  Physical Exam 1130: Physical examination:  Nursing notes reviewed; Vital signs and O2 SAT reviewed;  Constitutional: Well developed, Well nourished, In no acute distress; Head:  Normocephalic, atraumatic; Eyes: EOMI, PERRL, No scleral icterus; ENMT: Mouth and pharynx normal, Mucous membranes dry; Neck: Supple, Full range of motion, No lymphadenopathy; Cardiovascular: Bradycardic rate and regular rhythm, No gallop; Respiratory: Breath sounds clear & equal bilaterally, No rales, rhonchi, wheezes.  Speaking full sentences with ease, Normal respiratory effort/excursion; Chest: Nontender, Movement normal; Abdomen: Soft, Nontender, Nondistended, Normal bowel sounds;; Extremities: Pulses normal, No tenderness, No edema, No calf edema or asymmetry.;  Neuro: AA&Ox3, Major CN grossly intact.  Speech clear. No gross focal motor or sensory deficits in extremities.; Skin: Color normal, Warm, Dry.   ED Course  Procedures   MDM  MDM Reviewed: nursing note, vitals and previous chart Reviewed previous: ECG and labs Interpretation: ECG, labs and x-ray      Date: 05/19/2012  Rate: 35  Rhythm: sinus bradycardia  QRS Axis: normal  Intervals: normal  ST/T Wave abnormalities: normal  Conduction Disutrbances:nonspecific  intraventricular conduction delay  Narrative Interpretation:  LVH  Old EKG Reviewed: changes noted; rate slower compared to previous EKG dated 01/30/2010 (HR was 47).   Results for orders placed during the hospital encounter of 05/19/12  BASIC METABOLIC PANEL      Component Value Range   Sodium 138  135 - 145 mEq/L   Potassium 3.8  3.5 - 5.1 mEq/L   Chloride 106  96 - 112 mEq/L   CO2 23  19 - 32 mEq/L   Glucose, Bld 114 (*) 70 - 99 mg/dL   BUN 6  6 - 23 mg/dL   Creatinine, Ser 9.60  0.50 - 1.35 mg/dL   Calcium 9.0  8.4 - 45.4 mg/dL   GFR calc non Af Amer >90  >90 mL/min   GFR calc Af Amer >90  >90 mL/min  CBC WITH DIFFERENTIAL      Component Value Range   WBC 4.9  4.0 - 10.5 K/uL   RBC 3.69 (*) 4.22 - 5.81 MIL/uL   Hemoglobin 12.1 (*) 13.0 - 17.0 g/dL   HCT 09.8 (*) 11.9 - 14.7 %   MCV 96.5  78.0 - 100.0 fL   MCH 32.8  26.0 - 34.0 pg   MCHC 34.0  30.0 - 36.0 g/dL   RDW 82.9  56.2 - 13.0 %   Platelets 263  150 - 400 K/uL   Neutrophils Relative 39 (*) 43 - 77 %   Neutro Abs 1.9  1.7 - 7.7 K/uL   Lymphocytes Relative 45  12 - 46 %   Lymphs Abs 2.2  0.7 - 4.0 K/uL   Monocytes Relative 13 (*) 3 - 12 %   Monocytes Absolute 0.6  0.1 - 1.0 K/uL   Eosinophils Relative 2  0 - 5 %   Eosinophils Absolute 0.1  0.0 - 0.7 K/uL   Basophils Relative 1  0 - 1 %   Basophils Absolute 0.0  0.0 - 0.1 K/uL  TROPONIN I      Component Value Range   Troponin I <0.30  <0.30 ng/mL  URINALYSIS, ROUTINE W REFLEX MICROSCOPIC      Component Value Range   Color, Urine YELLOW  YELLOW   APPearance CLEAR  CLEAR   Specific Gravity, Urine 1.025  1.005 - 1.030   pH 6.0  5.0 - 8.0   Glucose, UA NEGATIVE  NEGATIVE mg/dL   Hgb urine dipstick NEGATIVE  NEGATIVE   Bilirubin Urine NEGATIVE  NEGATIVE   Ketones, ur NEGATIVE  NEGATIVE mg/dL   Protein, ur NEGATIVE  NEGATIVE mg/dL   Urobilinogen, UA 0.2  0.0 - 1.0 mg/dL   Nitrite NEGATIVE  NEGATIVE   Leukocytes, UA NEGATIVE  NEGATIVE  MAGNESIUM      Component  Value Range   Magnesium 1.8  1.5 - 2.5 mg/dL   Dg Chest Port 1 View 05/19/2012  *RADIOLOGY REPORT*  Clinical Data: Bradycardia.  Hypertension  PORTABLE CHEST - 1 VIEW  Comparison: 11/05/2011  Findings: Cardiac enlargement without heart failure.  Negative for  pneumonia or effusion.  Lungs are clear.  IMPRESSION: Cardiac enlargement.  No acute cardiopulmonary disease.   Original Report Authenticated By: Camelia Phenes, M.D.     2D Echocardiogram: Study Conclusions - Left ventricle: Global hypokinesis. The inferolateral wall moves less well than the other walls. The cavity size was mildly dilated. Wall thickness was increased in a pattern of mild LVH. Systolic function was moderately reduced. The estimated ejection fraction was in the range of 35% to 40%. Findings consistent with left ventricular diastolic dysfunction. - Pericardium, extracardiac: There was no pericardial effusion.    1525:  Pt is not orthostatic.  Pt has ambulated around the ED with HR increasing to 110, decreasing again to 40's when he sits back on stretcher.  Sats decreased to 88% R/A while ambulating, but increased to 99% R/A when sat back on stretcher.  Pt ambulated with steady, fast paced gait around the ED, no resp distress, no complaints of CP or SOB.   Denies lightheadedness/near syncope.  Pt continues to state he feels "fine" and wants to go home.  Echocardiogram obtained (as above).  T/C to [facility] Dr. Gala Romney, case discussed, including:  HPI, pertinent PM/SHx, VS/PE, dx testing, ED course and treatment:  requests to obtain TSH and to have pt follow up in the [facility] office this week.  [facility] [place] office called, made an appointment for pt tomorrow morning at 8:40am.  Pt and family agreeable to go to this appointment tomorrow morning.  Dx and testing d/w pt and family.  Questions answered.  Verb understanding, agreeable to d/c home with outpt f/u at [facility] office tomorrow  morning at 8:40am.     Laray Anger, DO 05/21/12 [phone]

## 2012-05-20 ENCOUNTER — Ambulatory Visit (INDEPENDENT_AMBULATORY_CARE_PROVIDER_SITE_OTHER): Payer: Medicaid Other | Admitting: Cardiology

## 2012-05-20 ENCOUNTER — Encounter: Payer: Self-pay | Admitting: Cardiology

## 2012-05-20 ENCOUNTER — Encounter: Payer: Self-pay | Admitting: *Deleted

## 2012-05-20 VITALS — BP 136/80 | HR 50 | Ht 64.0 in | Wt 110.1 lb

## 2012-05-20 DIAGNOSIS — I498 Other specified cardiac arrhythmias: Secondary | ICD-10-CM

## 2012-05-20 DIAGNOSIS — I10 Essential (primary) hypertension: Secondary | ICD-10-CM

## 2012-05-20 DIAGNOSIS — I428 Other cardiomyopathies: Secondary | ICD-10-CM

## 2012-05-20 DIAGNOSIS — I429 Cardiomyopathy, unspecified: Secondary | ICD-10-CM | POA: Insufficient documentation

## 2012-05-20 DIAGNOSIS — R001 Bradycardia, unspecified: Secondary | ICD-10-CM | POA: Insufficient documentation

## 2012-05-20 NOTE — Assessment & Plan Note (Signed)
Looks to be chronic based on available information, perhaps progressive with somewhat slower heart rates over time. It iis not entirely clear that this is symptomatic.Marland Kitchen He denies any syncope or recent dizziness, reports NYHA class II dyspnea, no chest pain. TSH is normal. He reports no recent tick bites. He is not on any rate lowering medications. He does have cardiomyopathy recently diagnosed by echocardiogram and a long-standing history of alcohol abuse. We will place a 24-hour Holter monitor to better document heart rate variability.

## 2012-05-20 NOTE — Assessment & Plan Note (Signed)
States that he has been taking Norvasc for approximately 3 months, started by Dr. Felecia Shelling.

## 2012-05-20 NOTE — Assessment & Plan Note (Signed)
Recently documented, LVEF 35-40% by echocardiogram done yesterday. Duration of this is not clear. One wonders about a nonischemic cardiomyopathy with substantial history of alcohol abuse over the last 40 years. He has no definite history of CAD or MI. Plan to proceed with an exercise Myoview to assess both adequacy of heart rate response to exercise and also ischemic burden. This will help guide further management. We will bring him back to the office to discuss results and medication adjustments.

## 2012-05-20 NOTE — Patient Instructions (Addendum)
Your physician recommends that you schedule a follow-up appointment in: 3 weeks  Your physician has recommended that you wear a holter monitor. Holter monitors are medical devices that record the heart's electrical activity. Doctors most often use these monitors to diagnose arrhythmias. Arrhythmias are problems with the speed or rhythm of the heartbeat. The monitor is a small, portable device. You can wear one while you do your normal daily activities. This is usually used to diagnose what is causing palpitations/syncope (passing out).  Stress Test

## 2012-05-20 NOTE — Progress Notes (Signed)
Clinical Summary Christopher Austin is a 56 y.o.male referred the office for cardiology consultation related to recent documentation of bradycardia while patient was undergoing a screening colonoscopy with Christopher Austin. His primary care physician is Dr. Felecia Austin.   Records indicate that he was seen in the [facility] ER yesterday, not particularly symptomatic with heart rates at rest in the 40s. He ambulated with heart rate increasing to 110, did have some oxygen desaturation to 88% on room air. He had an echocardiogram obtained, outlined below. Case was discussed with Christopher Austin at the time.  ECG from July 2011 reviewed showed sinus bradycardia at 47 beats per minute with LVH and repolarization changes. Tracing from yesterday showed marked sinus bradycardia at 35 beats per minute with LVH and repolarization abnormalities.  Echocardiogram done on 10/23 revealed mild LVH with global hypokinesis, more prominent in the inferolateral wall, LVEF 35-40%. There is no prior documented history of cardiomyopathy, MI, or CAD.  Recent lab work finds TSH 2.2, potassium 3.8, BUN 6, creatinine 0.7, hemoglobin 12.1, platelets 263, troponin less than 0.30, magnesium 1.8. Chest x-ray demonstrated cardiac enlargement without edema or effusions.  From a symptom perspective, Christopher Austin denies any recent unexplained syncope, palpitations, or breathlessness beyond NYHA class II. He reports a remote history of passing out and "seizures" approximately 25 years ago. He also has a very long-standing history of alcohol abuse, drinking "all day" for the last 40 years. He did stop drinking alcohol approximately 2 months ago and has stopped smoking as well. He only recently has been seeing Dr. Felecia Austin to establish primary care followup.   No Known Allergies   Current Outpatient Prescriptions on File Prior to Visit  Medication Sig Dispense Refill  . amLODipine (NORVASC) 5 MG tablet Take 5 mg by mouth daily.      . folic acid (FOLVITE) 1  MG tablet Take 1 mg by mouth daily.      . naproxen (NAPROSYN) 500 MG tablet Take 500 mg by mouth 2 (two) times daily as needed. For pain      . peg 3350 powder (MOVIPREP) 100 G SOLR Take 1 kit (100 g total) by mouth as directed.  1 kit  0  . thiamine (VITAMIN B-1) 100 MG tablet Take 100 mg by mouth daily.       Current Facility-Administered Medications on File Prior to Visit  Medication Dose Route Frequency Provider Last Rate Last Dose  . DISCONTD: 0.45 % sodium chloride infusion   Intravenous Continuous Christopher Ade, MD 20 mL/hr at 05/19/12 [phone]    . DISCONTD: 0.9 %  sodium chloride infusion   Intravenous Continuous Christopher Anger, DO      . DISCONTD: meperidine (DEMEROL) 100 MG/ML injection           . DISCONTD: meperidine (DEMEROL) injection    PRN Christopher Ade, MD   25 mg at 05/19/12 0940  . DISCONTD: midazolam (VERSED) 5 MG/5ML injection           . DISCONTD: midazolam (VERSED) 5 MG/5ML injection    PRN Christopher Ade, MD   2 mg at 05/19/12 0940  . DISCONTD: simethicone susp in sterile water 1000 mL irrigation    PRN Christopher Ade, MD        Past Medical History  Diagnosis Date  . Essential hypertension, benign     Past Surgical History  Procedure Date  . Right hand surgery 2011    Abscess drainage    Family  History  Problem Relation Age of Onset  . Hypertension Mother   . Hyperlipidemia Mother     Social History Christopher Austin reports that he has quit smoking. His smoking use included Cigarettes. He does not have any smokeless tobacco history on file. Christopher Austin reports that he does not drink alcohol.  Review of Systems Reports no melena or hematochezia. No syncope. Describes neuropathic sounding left thigh discomfort at times. No claudication. No orthopnea or PND. Stable appetite. No fevers or chills. No recent tick bites. Otherwise negative.  Physical Examination Filed Vitals:   05/20/12 0858  BP: 136/80  Pulse: 50   Filed Weights   05/20/12 0858  Weight:  110 lb 1.3 oz (49.932 kg)   Thin male in no acute distress. HEENT: Conjunctiva and lids normal, oropharynx clear with poor dentition. Neck: Supple, no elevated JVP or carotid bruits, no thyromegaly. Lungs: Clear to auscultation, nonlabored breathing at rest. Cardiac: Regular rate and rhythm, no S3 or significant systolic murmur, no pericardial rub. Abdomen: Soft, nontender, bowel sounds present. Extremities: No pitting edema, distal pulses 2+. Skin: Warm and dry. Musculoskeletal: No kyphosis. Neuropsychiatric: Alert and oriented x3, affect grossly appropriate.   Problem List and Plan   Bradycardia Looks to be chronic based on available information, perhaps progressive with somewhat slower heart rates over time. It iis not entirely clear that this is symptomatic.Marland Kitchen He denies any syncope or recent dizziness, reports NYHA class II dyspnea, no chest pain. TSH is normal. He reports no recent tick bites. He is not on any rate lowering medications. He does have cardiomyopathy recently diagnosed by echocardiogram and a long-standing history of alcohol abuse. We will place a 24-hour Holter monitor to better document heart rate variability.  Cardiomyopathy Recently documented, LVEF 35-40% by echocardiogram done yesterday. Duration of this is not clear. One wonders about a nonischemic cardiomyopathy with substantial history of alcohol abuse over the last 40 years. He has no definite history of CAD or MI. Plan to proceed with an exercise Myoview to assess both adequacy of heart rate response to exercise and also ischemic burden. This will help guide further management. We will bring him back to the office to discuss results and medication adjustments.  Essential hypertension, benign States that he has been taking Norvasc for approximately 3 months, started by Dr. Felecia Austin.    Jonelle Sidle, M.D., F.A.C.C.

## 2012-05-21 LAB — URINE CULTURE
Colony Count: NO GROWTH
Culture: NO GROWTH

## 2012-05-25 ENCOUNTER — Encounter: Payer: Self-pay | Admitting: Internal Medicine

## 2012-05-26 ENCOUNTER — Encounter (HOSPITAL_COMMUNITY)
Admission: RE | Admit: 2012-05-26 | Discharge: 2012-05-26 | Disposition: A | Discharge status: Home / Self Care | Payer: Medicaid Other | Source: Ambulatory Visit | Attending: Cardiology | Admitting: Cardiology

## 2012-05-26 ENCOUNTER — Ambulatory Visit (HOSPITAL_COMMUNITY)
Admission: RE | Admit: 2012-05-26 | Discharge: 2012-05-26 | Disposition: A | Discharge status: Home / Self Care | Payer: Medicaid Other | Source: Ambulatory Visit | Attending: Internal Medicine | Admitting: Internal Medicine

## 2012-05-26 ENCOUNTER — Encounter: Payer: Self-pay | Admitting: *Deleted

## 2012-05-26 ENCOUNTER — Encounter (HOSPITAL_COMMUNITY): Payer: Self-pay

## 2012-05-26 ENCOUNTER — Ambulatory Visit (HOSPITAL_COMMUNITY)
Admission: RE | Admit: 2012-05-26 | Discharge: 2012-05-26 | Payer: Medicaid Other | Source: Ambulatory Visit | Attending: Cardiology | Admitting: Cardiology

## 2012-05-26 ENCOUNTER — Encounter (HOSPITAL_COMMUNITY): Payer: Self-pay | Admitting: Cardiology

## 2012-05-26 DIAGNOSIS — I10 Essential (primary) hypertension: Secondary | ICD-10-CM

## 2012-05-26 DIAGNOSIS — R001 Bradycardia, unspecified: Secondary | ICD-10-CM

## 2012-05-26 DIAGNOSIS — R079 Chest pain, unspecified: Secondary | ICD-10-CM | POA: Insufficient documentation

## 2012-05-26 DIAGNOSIS — I498 Other specified cardiac arrhythmias: Secondary | ICD-10-CM | POA: Insufficient documentation

## 2012-05-26 DIAGNOSIS — I429 Cardiomyopathy, unspecified: Secondary | ICD-10-CM

## 2012-05-26 DIAGNOSIS — I428 Other cardiomyopathies: Secondary | ICD-10-CM | POA: Insufficient documentation

## 2012-05-26 MED ORDER — TECHNETIUM TC 99M SESTAMIBI - CARDIOLITE
10.0000 | Freq: Once | INTRAVENOUS | Status: AC | PRN
  Administered 2012-05-26: 11 via INTRAVENOUS

## 2012-05-26 MED ORDER — TECHNETIUM TC 99M SESTAMIBI - CARDIOLITE
30.0000 | Freq: Once | INTRAVENOUS | Status: AC | PRN
  Administered 2012-05-26: 12:00:00 28 via INTRAVENOUS

## 2012-05-26 MED ORDER — SODIUM CHLORIDE 0.9 % IJ SOLN
INTRAMUSCULAR | Status: AC
  Administered 2012-05-26: 10 mL via INTRAVENOUS
  Filled 2012-05-26: qty 10

## 2012-05-26 MED ORDER — REGADENOSON 0.4 MG/5ML IV SOLN
INTRAVENOUS | Status: AC
  Filled 2012-05-26: qty 5

## 2012-05-26 NOTE — Progress Notes (Signed)
*  PRELIMINARY RESULTS* Echocardiogram 24H Holter monitor has been performed.  Christopher Austin 05/26/2012, 12:58 PM

## 2012-05-26 NOTE — Progress Notes (Addendum)
Stress Lab Nurses Notes - [name]  Christopher Austin 05/26/2012 Reason for doing test: Bradycardia & Cardiomyopathy Type of test: Stress Cardiolite Nurse performing test: Parke Poisson, RN Nuclear Medicine Tech: Lou Cal Echo Tech: Not Applicable MD performing test: Ival Bible Family MD: Felecia Shelling Test explained and consent signed: yes IV started: 22g jelco, Saline lock flushed, No redness or edema and Saline lock started in radiology Symptoms: Mild SOB Treatment/Intervention: None Reason test stopped: fatigue and reached target HR After recovery IV was: Discontinued via X-ray tech and No redness or edema Patient to return to Nuc. Med at : 12:00 Patient discharged: Home Patient's Condition upon discharge was: stable Comments: During test peak BP 182/75 & HR 169.  Recovery BP 96/67 & HR 57.  Symptoms resolved in recovery. [name]

## 2012-05-28 ENCOUNTER — Other Ambulatory Visit: Payer: Self-pay | Admitting: Cardiology

## 2012-05-28 DIAGNOSIS — I10 Essential (primary) hypertension: Secondary | ICD-10-CM

## 2012-05-28 DIAGNOSIS — R001 Bradycardia, unspecified: Secondary | ICD-10-CM

## 2012-05-28 DIAGNOSIS — I429 Cardiomyopathy, unspecified: Secondary | ICD-10-CM

## 2012-05-29 NOTE — Procedures (Signed)
NAME:  Christopher Austin, Christopher Austin NO.:  [id number]  MEDICAL RECORD NO.:  [id number]  LOCATION:  [facility]                          FACILITY:  [facility]  PHYSICIAN:  Gerrit Friends. Dietrich Pates, MD, FACCDATE OF BIRTH:  [birth year]  DATE OF PROCEDURE:  05/26/2012 DATE OF DISCHARGE:  05/26/2012                               HOLTER MONITOR   REFERRING PHYSICIAN:  Jonelle Sidle, MD  CLINICAL DATA:  A 56 year old gentleman with bradycardia.  1. Continuous electrocardiographic recording was maintained for 24     hours during which the predominant rhythm was normal sinus with     substantial sinus bradycardia to a low rate of 40 bpm and minimal     sinus tachycardia to a peak rate of 105 bpm. 2. Extremely infrequent PVCs were identified, with only three noted     during the entire 24-hour interval.  These were interpolated. 3. Rare premature supraventricular complexes occurred at an average     rate of 4 per hour.  A single 3-beat run of supraventricular     tachycardia without symptoms was identified. 4. No significant ST-segment elevation or depression was seen. 5. A complete diary of activity was returned, but no symptoms were     reported.  IMPRESSION:  Unremarkable continuous electrocardiographic recording with no significant arrhythmias, but a relatively slow average heart rate of 56 minutes bpm.  The lowest recorded rate was 40 beats per minute. There was virtually no sinus tachycardia.     Gerrit Friends. Dietrich Pates, MD, [facility]     RMR/MEDQ  D:  05/28/2012  T:  05/29/2012  Job:  [id number]

## 2012-06-11 ENCOUNTER — Encounter: Payer: Self-pay | Admitting: *Deleted

## 2012-06-11 ENCOUNTER — Ambulatory Visit (INDEPENDENT_AMBULATORY_CARE_PROVIDER_SITE_OTHER): Payer: Medicaid Other | Admitting: Cardiology

## 2012-06-11 ENCOUNTER — Encounter: Payer: Self-pay | Admitting: Cardiology

## 2012-06-11 VITALS — BP 110/56 | HR 55 | Ht 64.0 in | Wt 114.0 lb

## 2012-06-11 DIAGNOSIS — I429 Cardiomyopathy, unspecified: Secondary | ICD-10-CM

## 2012-06-11 DIAGNOSIS — I10 Essential (primary) hypertension: Secondary | ICD-10-CM

## 2012-06-11 DIAGNOSIS — I471 Supraventricular tachycardia: Secondary | ICD-10-CM

## 2012-06-11 DIAGNOSIS — I428 Other cardiomyopathies: Secondary | ICD-10-CM

## 2012-06-11 MED ORDER — RAMIPRIL 5 MG PO CAPS: 5.0000 mg | ORAL_CAPSULE | Freq: Every day | ORAL | Status: DC

## 2012-06-11 NOTE — Patient Instructions (Addendum)
Your physician recommends that you schedule a follow-up appointment in: 2 MONTHS WITH SM  Your physician has recommended you make the following change in your medication:   1) Complete Norvasc prescription as directed, once complete STOP taking Norvasc 2) Start ALTACE 5MG  Daily  Your physician recommends that you return for lab work in: 2 WEEKS AFTER STARTING ALTACE

## 2012-06-11 NOTE — Progress Notes (Signed)
   Clinical Summary [name] is a 56 y.o.male presenting for followup. He was seen recently in late October. He states that he has been feeling relatively well, no angina, no palpitations, and NYHA class I-II dyspnea. No syncope.  Exercise Myoview reviewed showing no clear evidence of chronotropic incompetence, LVEF 37%, and no large ischemic defects with possible scar in the inferior wall. He did have bursts of probable PSVT during exercise. Cardiac monitor showed sinus bradycardia of 40 at a low, no pauses however. Ectopic beats and brief 3 beat run of SVT noted.  We reviewed his recent testing. I suspect he likely has a nonischemic cardiomyopathy at this point. He has a long history of alcohol abuse, although quit over last few months.   No Known Allergies  Current Outpatient Prescriptions  Medication Sig Dispense Refill  . amLODipine (NORVASC) 5 MG tablet Take 5 mg by mouth daily.      . folic acid (FOLVITE) 1 MG tablet Take 1 mg by mouth daily.      . naproxen (NAPROSYN) 500 MG tablet Take 500 mg by mouth 2 (two) times daily as needed. For pain      . thiamine (VITAMIN B-1) 100 MG tablet Take 100 mg by mouth daily.      . ramipril (ALTACE) 5 MG capsule Take 1 capsule (5 mg total) by mouth daily.  30 capsule  3    Past Medical History  Diagnosis Date  . Essential hypertension, benign   . Cardiomyopathy     Possibly nonischemic  . PSVT (paroxysmal supraventricular tachycardia)     Noted during exercise testing  . Bradycardia     No clear evidence of chronotropic incompetence    Social History Christopher Austin reports that he has quit smoking. His smoking use included Cigarettes. He does not have any smokeless tobacco history on file. Christopher Austin reports that he does not drink alcohol.  Review of Systems Negative except as outlined.  Physical Examination Filed Vitals:   06/11/12 1342  BP: 110/56  Pulse: 55   Filed Weights   06/11/12 1342  Weight: 114 lb (51.71 kg)     Thin male in no acute distress.  HEENT: Conjunctiva and lids normal, oropharynx clear with poor dentition.  Neck: Supple, no elevated JVP or carotid bruits, no thyromegaly.  Lungs: Clear to auscultation, nonlabored breathing at rest.  Cardiac: Regular rate and rhythm, no S3 or significant systolic murmur, no pericardial rub.  Abdomen: Soft, nontender, bowel sounds present.  Extremities: No pitting edema, distal pulses 2+.  Skin: Warm and dry.  Musculoskeletal: No kyphosis.  Neuropsychiatric: Alert and oriented x3, affect grossly appropriate.   Problem List and Plan   Cardiomyopathy Symptomatically stable. Will switch from Norvasc to Altace 5 mg daily and check BMET in 2-3 weeks. May be able to add Aldactone eventually No beta blocker with bradycardia. Followup arranged..  Essential hypertension, benign Blood pressure well controlled today.  PSVT (paroxysmal supraventricular tachycardia) Not certain if this is clinically important at this time. Will follow - no AVN blockers with bradycardia at baseline.    Jonelle Sidle, M.D., F.A.C.C.

## 2012-06-11 NOTE — Assessment & Plan Note (Signed)
Symptomatically stable. Will switch from Norvasc to Altace 5 mg daily and check BMET in 2-3 weeks. May be able to add Aldactone eventually No beta blocker with bradycardia. Followup arranged.Marland Kitchen

## 2012-06-11 NOTE — Assessment & Plan Note (Signed)
Blood pressure well-controlled today. 

## 2012-06-11 NOTE — Assessment & Plan Note (Signed)
Not certain if this is clinically important at this time. Will follow - no AVN blockers with bradycardia at baseline.

## 2012-06-27 ENCOUNTER — Emergency Department (HOSPITAL_COMMUNITY)
Admission: EM | Admit: 2012-06-27 | Discharge: 2012-06-27 | Discharge status: Against Medical Advice | Payer: Medicaid Other | Attending: Emergency Medicine | Admitting: Emergency Medicine

## 2012-06-27 ENCOUNTER — Emergency Department (HOSPITAL_COMMUNITY): Payer: Medicaid Other

## 2012-06-27 ENCOUNTER — Encounter (HOSPITAL_COMMUNITY): Payer: Self-pay | Admitting: *Deleted

## 2012-06-27 DIAGNOSIS — I10 Essential (primary) hypertension: Secondary | ICD-10-CM | POA: Insufficient documentation

## 2012-06-27 DIAGNOSIS — G8929 Other chronic pain: Secondary | ICD-10-CM | POA: Insufficient documentation

## 2012-06-27 DIAGNOSIS — F1010 Alcohol abuse, uncomplicated: Secondary | ICD-10-CM | POA: Insufficient documentation

## 2012-06-27 DIAGNOSIS — I498 Other specified cardiac arrhythmias: Secondary | ICD-10-CM | POA: Insufficient documentation

## 2012-06-27 DIAGNOSIS — R51 Headache: Secondary | ICD-10-CM | POA: Insufficient documentation

## 2012-06-27 DIAGNOSIS — I428 Other cardiomyopathies: Secondary | ICD-10-CM | POA: Insufficient documentation

## 2012-06-27 DIAGNOSIS — Z79899 Other long term (current) drug therapy: Secondary | ICD-10-CM | POA: Insufficient documentation

## 2012-06-27 DIAGNOSIS — Z87891 Personal history of nicotine dependence: Secondary | ICD-10-CM | POA: Insufficient documentation

## 2012-06-27 HISTORY — DX: Other chronic pain: G89.29

## 2012-06-27 HISTORY — DX: Headache: R51

## 2012-06-27 NOTE — ED Notes (Signed)
Pt refused blood work. EDP notified.

## 2012-06-27 NOTE — ED Notes (Signed)
Headache started this morning, admits to ETOH, FS 123, pt states his head is hurting worse. Per EMS 164/92 BP

## 2012-06-27 NOTE — ED Notes (Addendum)
Pt presents with headache x 2 days. Pt denies vision changes, N/V/D and nasal congestion. Pt reports ran out of B/P medication and head has been hurting ever since. NAD noted. Strong odor of etch noted, pt states he got his " medication at home but didn't take it cuz it says not to take while drinking".

## 2012-06-27 NOTE — ED Notes (Signed)
Pt removed saline lock in wrist, and left unit. EDP notified.

## 2012-06-27 NOTE — ED Provider Notes (Signed)
History     CSN: [id number]  Arrival date & time 06/27/12  1739   First MD Initiated Contact with Patient 06/27/12 1804      Chief Complaint  Patient presents with  . Headache  . Alcohol Intoxication     HPI Pt was seen at 1830.   Per pt, c/o gradual onset and persistence of constant dull generalized headache that began 3 days ago when he ran out of his BP meds.  States he picked up new rx yesterday but did not take them today because he "was drinking and you're not supposed to take those medicines if you're drinking."  Pt describes his headche as "dull" and per his usual headache pattern "when I don't take my meds."  Denies headache was sudden or maximal at onset or at any time. Denies CP/SOB, no abd pain, no N/V/D, no visual changes, no focal motor weakness, no tingling/numbness in extremities.      Past Medical History  Diagnosis Date  . Essential hypertension, benign   . Cardiomyopathy     Possibly nonischemic  . PSVT (paroxysmal supraventricular tachycardia)     Noted during exercise testing  . Bradycardia     No clear evidence of chronotropic incompetence  . Syncope     and "seizures" "25 years ago"  . Headache   . Alcoholic   . Chronic pain     Past Surgical History  Procedure Date  . Right hand surgery 2011    Abscess drainage  . Colonoscopy 05/19/2012    Procedure: COLONOSCOPY;  Surgeon: Corbin Ade, MD;  Location: [facility];  Service: Endoscopy;  Laterality: N/A;  9:30 AM    Family History  Problem Relation Age of Onset  . Hypertension Mother   . Hyperlipidemia Mother     History  Substance Use Topics  . Smoking status: Former Smoker    Types: Cigarettes  . Smokeless tobacco: Not on file  . Alcohol Use: Yes     Comment: quit 2 months ago then started again      Review of Systems ROS: Statement: All systems negative except as marked or noted in the HPI; Constitutional: Negative for fever and chills. ; ; Eyes: Negative for eye pain, redness  and discharge. ; ; ENMT: Negative for ear pain, hoarseness, nasal congestion, sinus pressure and sore throat. ; ; Cardiovascular: Negative for chest pain, palpitations, diaphoresis, dyspnea and peripheral edema. ; ; Respiratory: Negative for cough, wheezing and stridor. ; ; Gastrointestinal: Negative for nausea, vomiting, diarrhea, abdominal pain, blood in stool, hematemesis, jaundice and rectal bleeding. . ; ; Genitourinary: Negative for dysuria, flank pain and hematuria. ; ; Musculoskeletal: Negative for back pain and neck pain. Negative for swelling and trauma.; ; Skin: Negative for pruritus, rash, abrasions, blisters, bruising and skin lesion.; ; Neuro: +headache. Negative for lightheadedness and neck stiffness. Negative for weakness, altered level of consciousness , altered mental status, extremity weakness, paresthesias, involuntary movement, seizure and syncope.       Allergies  Review of patient's allergies indicates no known allergies.  Home Medications   Current Outpatient Rx  Name  Route  Sig  Dispense  Refill  . AMLODIPINE BESYLATE 5 MG PO TABS   Oral   Take 5 mg by mouth daily.         Marland Kitchen FOLIC ACID 1 MG PO TABS   Oral   Take 1 mg by mouth daily.         Marland Kitchen NAPROXEN 500 MG  PO TABS   Oral   Take 500 mg by mouth 2 (two) times daily as needed. For pain         . RAMIPRIL 5 MG PO CAPS   Oral   Take 1 capsule (5 mg total) by mouth daily.   30 capsule   3   . VITAMIN B-1 100 MG PO TABS   Oral   Take 100 mg by mouth daily.           BP 144/72  Pulse 67  Temp 98.3 F (36.8 C) (Oral)  Resp 18  Ht 5\' 4"  (1.626 m)  Wt 115 lb (52.164 kg)  BMI 19.74 kg/m2  SpO2 95%  Physical Exam 1835: Physical examination:  Nursing notes reviewed; Vital signs and O2 SAT reviewed;  Constitutional: Well developed, Well nourished, Well hydrated, In no acute distress; Head:  Normocephalic, atraumatic; Eyes: EOMI, PERRL, No scleral icterus; ENMT: Mouth and pharynx normal, Mucous  membranes moist; Neck: Supple, Full range of motion, No lymphadenopathy; Cardiovascular: Regular rate and rhythm, No gallop; Respiratory: Breath sounds clear & equal bilaterally, No rales, rhonchi, wheezes.  Speaking full sentences with ease, Normal respiratory effort/excursion; Chest: Nontender, Movement normal; Abdomen: Soft, Nontender, Nondistended, Normal bowel sounds; Genitourinary: No CVA tenderness; Extremities: Pulses normal, No tenderness, No edema, No calf edema or asymmetry.; Neuro: AA&Ox3, Major CN grossly intact.  Speech clear. No facial droop. Climbing on and off stretcher easily by himself.  Gait steady walking around exam room. No gross focal motor or sensory deficits in extremities.; Skin: Color normal, Warm, Dry.   ED Course  Procedures    MDM  MDM Reviewed: previous chart, nursing note and vitals Reviewed previous: labs, ECG and x-ray      1900:  Informed by ED RN that when she went in the room to check on the pt is was gone, having taken out his own IV (was laying on the bed).         Laray Anger, DO 06/28/12 1700

## 2012-06-27 NOTE — ED Notes (Signed)
Pt's response impaired due to ETOH

## 2012-06-28 ENCOUNTER — Ambulatory Visit (HOSPITAL_COMMUNITY): Payer: Medicaid Other

## 2012-06-28 ENCOUNTER — Ambulatory Visit (HOSPITAL_COMMUNITY): Admission: RE | Admit: 2012-06-28 | Payer: Medicaid Other | Source: Ambulatory Visit

## 2012-07-02 ENCOUNTER — Telehealth: Payer: Self-pay | Admitting: *Deleted

## 2012-07-02 DIAGNOSIS — I10 Essential (primary) hypertension: Secondary | ICD-10-CM

## 2012-07-02 LAB — BASIC METABOLIC PANEL
BUN: 9 mg/dL (ref 6–23)
CO2: 28 mEq/L (ref 19–32)
Glucose, Bld: 72 mg/dL (ref 70–99)
Potassium: 3.9 mEq/L (ref 3.5–5.3)
Sodium: 138 mEq/L (ref 135–145)

## 2012-07-02 NOTE — Telephone Encounter (Signed)
Pt called to advise that he is at the lab office and needs and ordered for labs faxed, afer review of chart order was faxed manually to [facility] for BMEt

## 2012-07-05 ENCOUNTER — Encounter: Payer: Self-pay | Admitting: *Deleted

## 2012-08-11 ENCOUNTER — Encounter: Payer: Medicaid Other | Admitting: Cardiology

## 2012-08-11 ENCOUNTER — Telehealth: Payer: Self-pay | Admitting: Cardiology

## 2012-08-11 ENCOUNTER — Encounter: Payer: Self-pay | Admitting: Cardiology

## 2012-08-11 NOTE — Progress Notes (Signed)
No show  This encounter was created in error - please disregard.

## 2012-08-11 NOTE — Telephone Encounter (Signed)
Patient no showed appointment.  Left message on patient's machine to call office to reschedule.  Letter also mailed. / tgs

## 2012-08-17 ENCOUNTER — Ambulatory Visit (INDEPENDENT_AMBULATORY_CARE_PROVIDER_SITE_OTHER): Payer: Medicaid Other | Admitting: Cardiology

## 2012-08-17 ENCOUNTER — Encounter: Payer: Self-pay | Admitting: Cardiology

## 2012-08-17 VITALS — BP 118/60 | HR 52 | Ht 64.0 in | Wt 112.0 lb

## 2012-08-17 DIAGNOSIS — I10 Essential (primary) hypertension: Secondary | ICD-10-CM

## 2012-08-17 DIAGNOSIS — I428 Other cardiomyopathies: Secondary | ICD-10-CM

## 2012-08-17 DIAGNOSIS — I471 Supraventricular tachycardia: Secondary | ICD-10-CM

## 2012-08-17 DIAGNOSIS — I429 Cardiomyopathy, unspecified: Secondary | ICD-10-CM

## 2012-08-17 NOTE — Patient Instructions (Addendum)
Your physician recommends that you schedule a follow-up appointment in:4 MONTHS WITH SM

## 2012-08-17 NOTE — Assessment & Plan Note (Signed)
Symptomatically stable, likely nonischemic in setting of regular alcohol intake, also PSVT. He has tolerated ACE inhibitor with stable renal function. Not on beta blocker with resting bradycardia, heart rate in the 50s.

## 2012-08-17 NOTE — Progress Notes (Signed)
   Clinical Summary Mr. Brandl is a 57 y.o.male presenting for followup. He was last seen in November 2013, missed his followup visit. He currently reports NYHA class I dyspnea, no palpitations. Still drinking alcohol. We have discussed cessation, also compliance with his medications. He reports no syncope.  Labwork from December 2013 reviewed finding potassium 3.9, sodium 138, BUN 9, creatinine 0.8. States he just had his medications refilled.  No Known Allergies  Current Outpatient Prescriptions  Medication Sig Dispense Refill  . amLODipine (NORVASC) 5 MG tablet Take 5 mg by mouth daily.      . folic acid (FOLVITE) 1 MG tablet Take 1 mg by mouth daily.      . naproxen (NAPROSYN) 500 MG tablet Take 500 mg by mouth 2 (two) times daily as needed. For pain      . ramipril (ALTACE) 5 MG capsule Take 1 capsule (5 mg total) by mouth daily.  30 capsule  3  . thiamine (VITAMIN B-1) 100 MG tablet Take 100 mg by mouth daily.        Past Medical History  Diagnosis Date  . Essential hypertension, benign   . Cardiomyopathy     Possibly nonischemic  . PSVT (paroxysmal supraventricular tachycardia)     Noted during exercise testing  . Bradycardia     No clear evidence of chronotropic incompetence  . Syncope     "Seizures" "25 years ago"  . Headache   . Alcoholic   . Chronic pain     Social History Mr. Engen reports that he has been smoking Cigarettes.  He has a 22 pack-year smoking history. He does not have any smokeless tobacco history on file. Mr. Yellen reports that he drinks alcohol.  Review of Systems No cough, fevers or chills. No angina. No syncope. Otherwise negative.  Physical Examination Filed Vitals:   08/17/12 1516  BP: 118/60  Pulse: 52   Filed Weights   08/17/12 1516  Weight: 112 lb (50.803 kg)    No acute distress.  HEENT: Conjunctiva and lids normal, oropharynx clear with poor dentition.  Neck: Supple, no elevated JVP or carotid bruits, no thyromegaly.  Lungs:  Clear to auscultation, nonlabored breathing at rest.  Cardiac: Regular rate and rhythm, no S3 or significant systolic murmur, no pericardial rub.  Abdomen: Soft, nontender, bowel sounds present.  Extremities: No pitting edema, distal pulses 2+.    Problem List and Plan   Cardiomyopathy Symptomatically stable, likely nonischemic in setting of regular alcohol intake, also PSVT. He has tolerated ACE inhibitor with stable renal function. Not on beta blocker with resting bradycardia, heart rate in the 50s.  PSVT (paroxysmal supraventricular tachycardia) Noted incidentally during previous treadmill testing. He denies any palpitations. Not specifically addressing this now with medications in light of resting bradycardia.  Essential hypertension, benign Normal blood pressure today.    Jonelle Sidle, M.D., F.A.C.C.

## 2012-08-17 NOTE — Assessment & Plan Note (Signed)
Normal blood pressure today. 

## 2012-08-17 NOTE — Assessment & Plan Note (Signed)
Noted incidentally during previous treadmill testing. He denies any palpitations. Not specifically addressing this now with medications in light of resting bradycardia.

## 2012-11-15 ENCOUNTER — Other Ambulatory Visit: Payer: Self-pay | Admitting: Cardiology

## 2012-11-15 ENCOUNTER — Encounter: Payer: Self-pay | Admitting: Cardiology

## 2012-11-15 MED ORDER — RAMIPRIL 5 MG PO CAPS: 5.0000 mg | ORAL_CAPSULE | Freq: Every day | ORAL | Status: DC

## 2012-12-15 ENCOUNTER — Encounter: Payer: Self-pay | Admitting: Cardiology

## 2012-12-15 ENCOUNTER — Ambulatory Visit (INDEPENDENT_AMBULATORY_CARE_PROVIDER_SITE_OTHER): Payer: Medicaid Other | Admitting: Cardiology

## 2012-12-15 VITALS — BP 106/54 | HR 57 | Ht 64.0 in | Wt 110.0 lb

## 2012-12-15 DIAGNOSIS — I471 Supraventricular tachycardia: Secondary | ICD-10-CM

## 2012-12-15 DIAGNOSIS — I10 Essential (primary) hypertension: Secondary | ICD-10-CM

## 2012-12-15 DIAGNOSIS — I429 Cardiomyopathy, unspecified: Secondary | ICD-10-CM

## 2012-12-15 DIAGNOSIS — I428 Other cardiomyopathies: Secondary | ICD-10-CM

## 2012-12-15 NOTE — Assessment & Plan Note (Signed)
No palpitations. He is not on beta blocker or calcium channel blocker with resting bradycardia.

## 2012-12-15 NOTE — Assessment & Plan Note (Signed)
Likely nonischemic. The patient reports compliance with his medications, and has stopped drinking alcohol. We will follow up with an echocardiogram to reassess LVEF.

## 2012-12-15 NOTE — Progress Notes (Signed)
   Clinical Summary Christopher Austin is a 57 y.o.male last seen in January of this year. He states that he has been feeling well, NYHA class I dyspnea, no chest pain or palpitations. He tells me that he has not consumed any alcohol this entire year, has also stopped smoking.  ECG shows sinus bradycardia with LVH and repolarization abnormalities.   No Known Allergies  Current Outpatient Prescriptions  Medication Sig Dispense Refill  . amLODipine (NORVASC) 5 MG tablet Take 5 mg by mouth daily.      . folic acid (FOLVITE) 1 MG tablet Take 1 mg by mouth daily.      . naproxen (NAPROSYN) 500 MG tablet Take 500 mg by mouth 2 (two) times daily as needed. For pain      . ramipril (ALTACE) 5 MG capsule Take 1 capsule (5 mg total) by mouth daily.  30 capsule  3  . thiamine (VITAMIN B-1) 100 MG tablet Take 100 mg by mouth daily.       No current facility-administered medications for this visit.    Past Medical History  Diagnosis Date  . Essential hypertension, benign   . Cardiomyopathy     Possibly nonischemic  . PSVT (paroxysmal supraventricular tachycardia)     Noted during exercise testing  . Bradycardia     No clear evidence of chronotropic incompetence  . Syncope     "Seizures" "25 years ago"  . Headache   . Alcoholic   . Chronic pain     Social History Christopher Austin reports that he has quit smoking. His smoking use included Cigarettes. He has a 22 pack-year smoking history. He does not have any smokeless tobacco history on file. Christopher Austin reports that  drinks alcohol.  Review of Systems Recently had all of his teeth extracted, will be getting dentures next month. Otherwise negative.  Physical Examination Filed Vitals:   12/15/12 1019  BP: 106/54  Pulse: 57   Filed Weights   12/15/12 1019  Weight: 110 lb (49.896 kg)    No acute distress.  HEENT: Conjunctiva and lids normal, oropharynx clear.  Neck: Supple, no elevated JVP or carotid bruits, no thyromegaly.  Lungs: Clear to  auscultation, nonlabored breathing at rest.  Cardiac: Regular rate and rhythm, no S3 or significant systolic murmur, no pericardial rub.  Abdomen: Soft, nontender, bowel sounds present.  Extremities: No pitting edema, distal pulses 2+.    Problem List and Plan   Cardiomyopathy Likely nonischemic. The patient reports compliance with his medications, and has stopped drinking alcohol. We will follow up with an echocardiogram to reassess LVEF.  Essential hypertension, benign Blood pressure well controlled.  PSVT (paroxysmal supraventricular tachycardia) No palpitations. He is not on beta blocker or calcium channel blocker with resting bradycardia.    Jonelle Sidle, M.D., F.A.C.C.

## 2012-12-15 NOTE — Assessment & Plan Note (Signed)
Blood pressure well controlled

## 2012-12-15 NOTE — Patient Instructions (Addendum)
Your physician recommends that you schedule a follow-up appointment in: 4 months with SM  Your physician has requested that you have an echocardiogram. Echocardiography is a painless test that uses sound waves to create images of your heart. It provides your doctor with information about the size and shape of your heart and how well your heart's chambers and valves are working. This procedure takes approximately one hour. There are no restrictions for this procedure.

## 2012-12-17 ENCOUNTER — Ambulatory Visit (HOSPITAL_COMMUNITY)
Admission: RE | Admit: 2012-12-17 | Discharge: 2012-12-17 | Disposition: A | Discharge status: Home / Self Care | Payer: Medicaid Other | Source: Ambulatory Visit | Attending: Cardiology | Admitting: Cardiology

## 2012-12-17 DIAGNOSIS — I429 Cardiomyopathy, unspecified: Secondary | ICD-10-CM

## 2012-12-17 DIAGNOSIS — I428 Other cardiomyopathies: Secondary | ICD-10-CM | POA: Insufficient documentation

## 2012-12-17 DIAGNOSIS — I498 Other specified cardiac arrhythmias: Secondary | ICD-10-CM | POA: Insufficient documentation

## 2012-12-17 DIAGNOSIS — I10 Essential (primary) hypertension: Secondary | ICD-10-CM | POA: Insufficient documentation

## 2012-12-17 NOTE — Progress Notes (Signed)
*  PRELIMINARY RESULTS* Echocardiogram 2D Echocardiogram has been performed.  Christopher Austin 12/17/2012, 2:22 PM

## 2013-01-21 ENCOUNTER — Encounter (HOSPITAL_COMMUNITY): Payer: Self-pay | Admitting: *Deleted

## 2013-01-21 ENCOUNTER — Emergency Department (HOSPITAL_COMMUNITY)
Admission: EM | Admit: 2013-01-21 | Discharge: 2013-01-21 | Disposition: A | Discharge status: Home / Self Care | Payer: Medicaid Other | Attending: Emergency Medicine | Admitting: Emergency Medicine

## 2013-01-21 DIAGNOSIS — L255 Unspecified contact dermatitis due to plants, except food: Secondary | ICD-10-CM | POA: Insufficient documentation

## 2013-01-21 DIAGNOSIS — I10 Essential (primary) hypertension: Secondary | ICD-10-CM | POA: Insufficient documentation

## 2013-01-21 DIAGNOSIS — L237 Allergic contact dermatitis due to plants, except food: Secondary | ICD-10-CM

## 2013-01-21 DIAGNOSIS — Z79899 Other long term (current) drug therapy: Secondary | ICD-10-CM | POA: Insufficient documentation

## 2013-01-21 DIAGNOSIS — Z8679 Personal history of other diseases of the circulatory system: Secondary | ICD-10-CM | POA: Insufficient documentation

## 2013-01-21 DIAGNOSIS — Z87891 Personal history of nicotine dependence: Secondary | ICD-10-CM | POA: Insufficient documentation

## 2013-01-21 MED ORDER — DIPHENHYDRAMINE HCL 25 MG PO CAPS
25.0000 mg | ORAL_CAPSULE | Freq: Once | ORAL | Status: AC
  Administered 2013-01-21: 25 mg via ORAL
  Filled 2013-01-21: qty 1

## 2013-01-21 MED ORDER — PREDNISONE 10 MG PO TABS: ORAL_TABLET | ORAL | Status: DC

## 2013-01-21 MED ORDER — DIPHENHYDRAMINE HCL 25 MG PO CAPS: 25.0000 mg | ORAL_CAPSULE | Freq: Four times a day (QID) | ORAL | Status: DC | PRN

## 2013-01-21 MED ORDER — DEXAMETHASONE SODIUM PHOSPHATE 10 MG/ML IJ SOLN
10.0000 mg | Freq: Once | INTRAMUSCULAR | Status: AC
  Administered 2013-01-21: 10 mg via INTRAMUSCULAR
  Filled 2013-01-21: qty 1

## 2013-01-21 NOTE — ED Provider Notes (Signed)
Medical screening examination/treatment/procedure(s) were performed by non-physician practitioner and as supervising physician I was immediately available for consultation/collaboration.  Caliah Kopke, MD 01/21/13 2344 

## 2013-01-21 NOTE — ED Notes (Signed)
Rash to face, cold sx

## 2013-01-21 NOTE — ED Provider Notes (Signed)
History    CSN: [id number] Arrival date & time 01/21/13  1901  First MD Initiated Contact with Patient 01/21/13 1910     Chief Complaint  Patient presents with  . Rash   (Consider location/radiation/quality/duration/timing/severity/associated sxs/prior Treatment) HPI Comments: Christopher Austin is a 57 y.o. Male presenting with rash to his forearms and now his face after being exposed to poison ivy yesterday while working in his garden.  He reports itching to his forearms which has not responded to calamine lotion,and woke today with rash to his forehead. He has found that cool compresses help with the itching. He denies fevers or chills and has no other complaints.     The history is provided by the patient.   Past Medical History  Diagnosis Date  . Essential hypertension, benign   . Cardiomyopathy     Possibly nonischemic  . PSVT (paroxysmal supraventricular tachycardia)     Noted during exercise testing  . Bradycardia     No clear evidence of chronotropic incompetence  . Syncope     "Seizures" "25 years ago"  . Headache(784.0)   . Alcoholic   . Chronic pain    Past Surgical History  Procedure Laterality Date  . Right hand surgery  2011    Abscess drainage  . Colonoscopy  05/19/2012    Procedure: COLONOSCOPY;  Surgeon: Corbin Ade, MD;  Location: [facility];  Service: Endoscopy;  Laterality: N/A;  9:30 AM   Family History  Problem Relation Age of Onset  . Hypertension Mother   . Hyperlipidemia Mother    History  Substance Use Topics  . Smoking status: Former Smoker -- 1.00 packs/day for 22 years    Types: Cigarettes  . Smokeless tobacco: Not on file     Comment: using patches  . Alcohol Use: Yes     Comment: quit 2 months ago then started again    Review of Systems  Constitutional: Negative for fever and chills.  HENT: Negative for facial swelling.   Respiratory: Negative for shortness of breath and wheezing.   Skin: Positive for rash.   Neurological: Negative for numbness.    Allergies  Review of patient's allergies indicates no known allergies.  Home Medications   Current Outpatient Rx  Name  Route  Sig  Dispense  Refill  . amLODipine (NORVASC) 5 MG tablet   Oral   Take 5 mg by mouth daily.         . diphenhydrAMINE (BENADRYL) 25 mg capsule   Oral   Take 1 capsule (25 mg total) by mouth every 6 (six) hours as needed for itching.   30 capsule   0   . folic acid (FOLVITE) 1 MG tablet   Oral   Take 1 mg by mouth daily.         . naproxen (NAPROSYN) 500 MG tablet   Oral   Take 500 mg by mouth 2 (two) times daily as needed. For pain         . predniSONE (DELTASONE) 10 MG tablet      6, 5, 4, 3, 2 then 1 tablet by mouth daily for 6 days total.   21 tablet   0   . ramipril (ALTACE) 5 MG capsule   Oral   Take 1 capsule (5 mg total) by mouth daily.   30 capsule   3     Reminder -- Keep 12-15-2012 appointment with Dr. Erlinda Hong ...   . thiamine (VITAMIN B-1) 100  MG tablet   Oral   Take 100 mg by mouth daily.          BP 128/71  Pulse 72  Temp(Src) 97.9 F (36.6 C) (Oral)  Resp 18  Ht 5\' 3"  (1.6 m)  Wt 112 lb 2 oz (50.86 kg)  BMI 19.87 kg/m2  SpO2 99% Physical Exam  Constitutional: He appears well-developed and well-nourished. No distress.  HENT:  Head: Normocephalic.  Neck: Neck supple.  Cardiovascular: Normal rate.   Pulmonary/Chest: Effort normal. He has no wheezes.  Musculoskeletal: Normal range of motion. He exhibits no edema.  Skin: Rash noted. Rash is vesicular.   Vesicular rash bilateral forearms and forehead, no drainage, no excoriations.    ED Course  Procedures (including critical care time) Labs Reviewed - No data to display No results found. 1. Poison ivy dermatitis     MDM  Decadron IM given,  Pt prescribed prednisone 6 day taper,  Benadryl for itch.  Prn f/u with pcp, or return here for worsened sx.  [name], PA-C 01/21/13 1956

## 2013-03-16 ENCOUNTER — Ambulatory Visit (INDEPENDENT_AMBULATORY_CARE_PROVIDER_SITE_OTHER): Payer: Medicaid Other | Admitting: Cardiology

## 2013-03-16 ENCOUNTER — Encounter: Payer: Self-pay | Admitting: Cardiology

## 2013-03-16 VITALS — BP 112/68 | HR 54 | Ht 64.0 in | Wt 106.2 lb

## 2013-03-16 DIAGNOSIS — I429 Cardiomyopathy, unspecified: Secondary | ICD-10-CM

## 2013-03-16 DIAGNOSIS — I428 Other cardiomyopathies: Secondary | ICD-10-CM

## 2013-03-16 DIAGNOSIS — I471 Supraventricular tachycardia, unspecified: Secondary | ICD-10-CM

## 2013-03-16 DIAGNOSIS — I10 Essential (primary) hypertension: Secondary | ICD-10-CM

## 2013-03-16 MED ORDER — RAMIPRIL 5 MG PO CAPS: 5.0000 mg | ORAL_CAPSULE | Freq: Every day | ORAL | Status: DC

## 2013-03-16 NOTE — Assessment & Plan Note (Signed)
This remains quiescent. Due to resting bradycardia, he is not on any rate lowering medications.

## 2013-03-16 NOTE — Patient Instructions (Addendum)
Your physician recommends that you schedule a follow-up appointment in: 6 MONTHS WITH DR SM  Your physician recommends that you continue on your current medications as directed. Please refer to the Current Medication list given to you today.  

## 2013-03-16 NOTE — Assessment & Plan Note (Signed)
LVEF has normalized. Continue medical therapy, patient reports no further alcohol intake. Followup arranged.

## 2013-03-16 NOTE — Assessment & Plan Note (Signed)
Blood pressure well-controlled today. 

## 2013-03-16 NOTE — Progress Notes (Signed)
   Clinical Summary Mr. Manseau is a 57 y.o.male last seen in May. 10 he is doing well, NYHA class I dyspnea, no chest pain or palpitations. He states that he has been compliant with his medications, has not returned to drinking any alcohol.  Followup echocardiogram done in May demonstrated normalization of LV function, LVEF 55% without wall motion abnormalities. I reviewed this with him today.   No Known Allergies  Current Outpatient Prescriptions  Medication Sig Dispense Refill  . amLODipine (NORVASC) 5 MG tablet Take 5 mg by mouth daily.      . folic acid (FOLVITE) 1 MG tablet Take 1 mg by mouth daily.      . ramipril (ALTACE) 5 MG capsule Take 1 capsule (5 mg total) by mouth daily.  30 capsule  3  . thiamine (VITAMIN B-1) 100 MG tablet Take 100 mg by mouth daily.       No current facility-administered medications for this visit.    Past Medical History  Diagnosis Date  . Essential hypertension, benign   . 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 PSVT (paroxysmal supraventricular tachycardia)     Noted during exercise testing  . Bradycardia     No clear evidence of chronotropic incompetence  . Syncope     "Seizures" "25 years ago"  . Headache(784.0)   . Alcoholic   . Chronic pain     Social History Mr. Ryans reports that he has quit smoking. His smoking use included Cigarettes. He has a 22 pack-year smoking history. He does not have any smokeless tobacco history on file. Mr. Elahi reports that  drinks alcohol.  Review of Systems Had multiple tooth extractions, now has dentures. Otherwise negative.  Physical Examination Filed Vitals:   03/16/13 0957  BP: 112/68  Pulse: 54   Filed Weights   03/16/13 0957  Weight: 106 lb 4 oz (48.195 kg)    No acute distress.  HEENT: Conjunctiva and lids normal, oropharynx clear.  Neck: Supple, no elevated JVP or carotid bruits, no thyromegaly.  Lungs: Clear to auscultation, nonlabored breathing at rest.  Cardiac: Regular  rate and rhythm, no S3 or significant systolic murmur, no pericardial rub.  Abdomen: Soft, nontender, bowel sounds present.  Extremities: No pitting edema, distal pulses 2+.    Problem List and Plan   Cardiomyopathy LVEF has normalized. Continue medical therapy, patient reports no further alcohol intake. Followup arranged.  PSVT (paroxysmal supraventricular tachycardia) This remains quiescent. Due to resting bradycardia, he is not on any rate lowering medications.  Essential hypertension, benign Blood pressure well controlled today.    Jonelle Sidle, M.D., F.A.C.C.

## 2013-03-16 NOTE — Addendum Note (Signed)
Addended by: Derry Lory A on: 03/16/2013 04:40 PM   Modules accepted: Orders

## 2013-06-16 ENCOUNTER — Ambulatory Visit (INDEPENDENT_AMBULATORY_CARE_PROVIDER_SITE_OTHER): Payer: Medicaid Other | Admitting: Cardiology

## 2013-06-16 ENCOUNTER — Encounter: Payer: Self-pay | Admitting: Cardiology

## 2013-06-16 VITALS — BP 118/66 | HR 68 | Ht 64.0 in | Wt 109.0 lb

## 2013-06-16 DIAGNOSIS — I471 Supraventricular tachycardia, unspecified: Secondary | ICD-10-CM

## 2013-06-16 DIAGNOSIS — I429 Cardiomyopathy, unspecified: Secondary | ICD-10-CM

## 2013-06-16 DIAGNOSIS — I10 Essential (primary) hypertension: Secondary | ICD-10-CM

## 2013-06-16 MED ORDER — SILDENAFIL CITRATE 25 MG PO TABS: 25.0000 mg | ORAL_TABLET | Freq: Every day | ORAL | Status: DC | PRN

## 2013-06-16 NOTE — Patient Instructions (Signed)
Your physician recommends that you continue on your current medications as directed. Please refer to the Current Medication list given to you today.  Your physician recommends that you schedule a follow-up appointment in: 4 months  

## 2013-06-16 NOTE — Progress Notes (Signed)
    Clinical Summary [name] is a 57 y.o.male last seen in August. He presents for routine followup, also had a medication question. Symptomatically he reports NYHA class I dyspnea, no palpitations, no chest pain or syncope. He reports compliance with his medications. He does have a prescription given him by his primary care provider for generic Viagra. We reviewed his medications. He is on antihypertensives, but nonspecifically contraindicated for concurrent use with Viagra. I asked him to be mindful for any dizziness or syncope. He is not using nitrates.  Followup echocardiogram done in May demonstrated normalization of LV function, LVEF 55% without wall motion abnormalities.   No Known Allergies  Current Outpatient Prescriptions  Medication Sig Dispense Refill  . amLODipine (NORVASC) 5 MG tablet Take 5 mg by mouth daily.      . folic acid (FOLVITE) 1 MG tablet Take 1 mg by mouth daily.      . ramipril (ALTACE) 5 MG capsule Take 1 capsule (5 mg total) by mouth daily.  30 capsule  6  . sildenafil (REVATIO) 20 MG tablet Take 20 mg by mouth 3 (three) times daily.      . tamsulosin (FLOMAX) 0.4 MG CAPS capsule Take 0.4 mg by mouth daily.      Marland Kitchen thiamine (VITAMIN B-1) 100 MG tablet Take 100 mg by mouth daily.       No current facility-administered medications for this visit.    Past Medical History  Diagnosis Date  . Essential hypertension, benign   . 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 PSVT (paroxysmal supraventricular tachycardia)     Noted during exercise testing  . Bradycardia     No clear evidence of chronotropic incompetence  . Syncope     "Seizures" "25 years ago"  . Headache(784.0)   . Alcoholic   . Chronic pain     Social History Christopher Austin reports that he has quit smoking. His smoking use included Cigarettes. He has a 22 pack-year smoking history. He does not have any smokeless tobacco history on file. Christopher Austin reports that he drinks alcohol.  Review of  Systems Negative except as outlined.  Physical Examination Filed Vitals:   06/16/13 1311  BP: 118/66  Pulse: 68   Filed Weights   06/16/13 1311  Weight: 109 lb (49.442 kg)    Comfortable at rest.  HEENT: Conjunctiva and lids normal, oropharynx clear.  Neck: Supple, no elevated JVP or carotid bruits, no thyromegaly.  Lungs: Clear to auscultation, nonlabored breathing at rest.  Cardiac: Regular rate and rhythm, no S3 or significant systolic murmur, no pericardial rub.  Abdomen: Soft, nontender, bowel sounds present.  Extremities: No pitting edema, distal pulses 2+.    Problem List and Plan   Cardiomyopathy Symptomatically stable, normalization of LVEF on medical therapy by a last echocardiogram. Continue observation.  Essential hypertension, benign Blood pressure is well-controlled today. He does plan to try using generic Viagra. He is on Norvasc and Altace, neither of which is contraindicated for concurrent use. I asked him to be mindful for any dizziness or documented hypotension that might require medicine adjustments.  PSVT (paroxysmal supraventricular tachycardia) Quiescent at this time.    Jonelle Sidle, M.D., F.A.C.C.

## 2013-06-16 NOTE — Assessment & Plan Note (Signed)
Blood pressure is well-controlled today. He does plan to try using generic Viagra. He is on Norvasc and Altace, neither of which is contraindicated for concurrent use. I asked him to be mindful for any dizziness or documented hypotension that might require medicine adjustments.

## 2013-06-16 NOTE — Assessment & Plan Note (Signed)
Quiescent at this time

## 2013-06-16 NOTE — Assessment & Plan Note (Signed)
Symptomatically stable, normalization of LVEF on medical therapy by a last echocardiogram. Continue observation.

## 2013-07-08 ENCOUNTER — Emergency Department (HOSPITAL_COMMUNITY): Payer: Medicaid Other

## 2013-07-08 ENCOUNTER — Emergency Department (HOSPITAL_COMMUNITY)
Admission: EM | Admit: 2013-07-08 | Discharge: 2013-07-08 | Disposition: A | Discharge status: Home / Self Care | Payer: Medicaid Other | Attending: Emergency Medicine | Admitting: Emergency Medicine

## 2013-07-08 ENCOUNTER — Encounter (HOSPITAL_COMMUNITY): Payer: Self-pay | Admitting: Emergency Medicine

## 2013-07-08 DIAGNOSIS — Z79899 Other long term (current) drug therapy: Secondary | ICD-10-CM | POA: Insufficient documentation

## 2013-07-08 DIAGNOSIS — F1021 Alcohol dependence, in remission: Secondary | ICD-10-CM | POA: Insufficient documentation

## 2013-07-08 DIAGNOSIS — G8929 Other chronic pain: Secondary | ICD-10-CM | POA: Insufficient documentation

## 2013-07-08 DIAGNOSIS — M7660 Achilles tendinitis, unspecified leg: Secondary | ICD-10-CM | POA: Insufficient documentation

## 2013-07-08 DIAGNOSIS — Z87891 Personal history of nicotine dependence: Secondary | ICD-10-CM | POA: Insufficient documentation

## 2013-07-08 DIAGNOSIS — I10 Essential (primary) hypertension: Secondary | ICD-10-CM | POA: Insufficient documentation

## 2013-07-08 DIAGNOSIS — M7661 Achilles tendinitis, right leg: Secondary | ICD-10-CM

## 2013-07-08 MED ORDER — HYDROCODONE-ACETAMINOPHEN 5-325 MG PO TABS: 1.0000 | ORAL_TABLET | ORAL | Status: DC | PRN

## 2013-07-08 MED ORDER — PREDNISONE 50 MG PO TABS
60.0000 mg | ORAL_TABLET | Freq: Once | ORAL | Status: AC
  Administered 2013-07-08: 60 mg via ORAL
  Filled 2013-07-08 (×2): qty 1

## 2013-07-08 MED ORDER — HYDROCODONE-ACETAMINOPHEN 5-325 MG PO TABS
1.0000 | ORAL_TABLET | Freq: Once | ORAL | Status: AC
  Administered 2013-07-08: 1 via ORAL
  Filled 2013-07-08: qty 1

## 2013-07-08 MED ORDER — IBUPROFEN 600 MG PO TABS
600.0000 mg | ORAL_TABLET | Freq: Four times a day (QID) | ORAL | Status: DC | PRN
Start: 2013-07-08 — End: 2014-07-14

## 2013-07-08 NOTE — ED Provider Notes (Signed)
CSN: [id number]     Arrival date & time 07/08/13  1335 History   First MD Initiated Contact with Patient 07/08/13 1422     Chief Complaint  Patient presents with  . Ankle Pain   (Consider location/radiation/quality/duration/timing/severity/associated sxs/prior Treatment) HPI Comments: Christopher Austin is a 57 y.o. Male presenting with pain and swelling of his right distal achilles tendon area since yesterday.  He denies injury to the area, but reports wore tall boots while he was helping do yard work and was more active than normal.  He has swelling and redness which is worsened with attempts to flex and extend the foot.  He is weight bearing but with discomfort.  He reports prior history of similar symptoms and was told it was gout (3-4 years ago) with no additional flare ups until now.  He has used ice without relief.     The history is provided by the patient.    Past Medical History  Diagnosis Date  . Essential hypertension, benign   . 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 PSVT (paroxysmal supraventricular tachycardia)     Noted during exercise testing  . Bradycardia     No clear evidence of chronotropic incompetence  . Syncope     "Seizures" "25 years ago"  . Headache(784.0)   . Alcoholic   . Chronic pain    Past Surgical History  Procedure Laterality Date  . Right hand surgery  2011    Abscess drainage  . Colonoscopy  05/19/2012    Procedure: COLONOSCOPY;  Surgeon: Corbin Ade, MD;  Location: [facility];  Service: Endoscopy;  Laterality: N/A;  9:30 AM   Family History  Problem Relation Age of Onset  . Hypertension Mother   . Hyperlipidemia Mother    History  Substance Use Topics  . Smoking status: Former Smoker -- 1.00 packs/day for 22 years    Types: Cigarettes  . Smokeless tobacco: Not on file     Comment: using patches  . Alcohol Use: Yes     Comment: quit 2 months ago then started again    Review of Systems  Constitutional: Negative for  fever and chills.  Musculoskeletal: Positive for arthralgias and joint swelling. Negative for myalgias.  Skin: Positive for color change. Negative for wound.  Neurological: Negative for weakness and numbness.    Allergies  Review of patient's allergies indicates no known allergies.  Home Medications   Current Outpatient Rx  Name  Route  Sig  Dispense  Refill  . amLODipine (NORVASC) 5 MG tablet   Oral   Take 5 mg by mouth daily.         . folic acid (FOLVITE) 1 MG tablet   Oral   Take 1 mg by mouth daily.         . ramipril (ALTACE) 5 MG capsule   Oral   Take 1 capsule (5 mg total) by mouth daily.   30 capsule   6   . tamsulosin (FLOMAX) 0.4 MG CAPS capsule   Oral   Take 0.4 mg by mouth daily.         Marland Kitchen thiamine (VITAMIN B-1) 100 MG tablet   Oral   Take 100 mg by mouth daily.         Marland Kitchen HYDROcodone-acetaminophen (NORCO/VICODIN) 5-325 MG per tablet   Oral   Take 1 tablet by mouth every 4 (four) hours as needed for moderate pain.   15 tablet   0   .  ibuprofen (ADVIL,MOTRIN) 600 MG tablet   Oral   Take 1 tablet (600 mg total) by mouth every 6 (six) hours as needed.   20 tablet   0   . sildenafil (VIAGRA) 25 MG tablet   Oral   Take 1 tablet (25 mg total) by mouth daily as needed for erectile dysfunction.   10 tablet   0    BP 121/71  Pulse 52  Temp(Src) 98.1 F (36.7 C) (Oral)  Resp 18  Ht 5\' 4"  (1.626 m)  Wt 106 lb (48.081 kg)  BMI 18.19 kg/m2  SpO2 100% Physical Exam  Constitutional: He appears well-developed and well-nourished.  HENT:  Head: Atraumatic.  Neck: Normal range of motion.  Cardiovascular:  Pulses equal bilaterally  Musculoskeletal: He exhibits tenderness.       Right foot: He exhibits tenderness and swelling.       Feet:  Ttp, modest edema and erythema posterior distal achilles area.  Skin intact, no red streaking, no induration.  Dorsalis pedis pulse intact.  Less than 3 sec cap refill in toes.  Nontender to palpation of  right calcaneous insertion and gastroc muscles.    Neurological: He is alert. He has normal strength. He displays normal reflexes. No sensory deficit.  Equal strength  Skin: Skin is warm and dry.  Psychiatric: He has a normal mood and affect.    ED Course  Procedures (including critical care time) Labs Review Labs Reviewed - No data to display Imaging Review Dg Ankle Complete Right  07/08/2013   CLINICAL DATA:  Posterior right ankle pain and Achilles tendon pain. No known injuries.  EXAM: RIGHT ANKLE - COMPLETE 3+ VIEW  COMPARISON:  None.  FINDINGS: No evidence of acute or subacute fracture. Ankle mortise intact with well-preserved joint space. Well-preserved bone mineral density. No intrinsic osseous abnormalities. Calcifications are present within the subcutaneous tissues.  IMPRESSION: Normal right ankle.   Electronically Signed   By: Hulan Saas M.D.   On: 07/08/2013 16:08    EKG Interpretation   None       MDM   1. Achilles tendonitis, right    Pt was prescribed ibuprofen, hydrocodone.  Offered ace wrap or aso to stabilize the joint, pt defers.  He reports will minimize use,  Cane if needed.  Will f/u with Dr. Romeo Apple next week -patient agrees with plan and will call for appt if not improving.    [name], PA-C 07/08/13 1635

## 2013-07-08 NOTE — ED Notes (Signed)
Pain rt post ankle at achilles tendon. Since last night, No injury.

## 2013-07-11 NOTE — ED Provider Notes (Signed)
Medical screening examination/treatment/procedure(s) were performed by non-physician practitioner and as supervising physician I was immediately available for consultation/collaboration.  EKG Interpretation   None        Kyrin Garn R. Kaylenn Civil, MD 07/11/13 2112 

## 2013-09-19 ENCOUNTER — Ambulatory Visit (INDEPENDENT_AMBULATORY_CARE_PROVIDER_SITE_OTHER): Payer: Medicaid Other | Admitting: Cardiology

## 2013-09-19 ENCOUNTER — Encounter: Payer: Self-pay | Admitting: Cardiology

## 2013-09-19 VITALS — BP 122/64 | HR 51 | Ht 64.0 in | Wt 109.0 lb

## 2013-09-19 DIAGNOSIS — I429 Cardiomyopathy, unspecified: Secondary | ICD-10-CM

## 2013-09-19 DIAGNOSIS — I428 Other cardiomyopathies: Secondary | ICD-10-CM

## 2013-09-19 DIAGNOSIS — I471 Supraventricular tachycardia: Secondary | ICD-10-CM

## 2013-09-19 DIAGNOSIS — I10 Essential (primary) hypertension: Secondary | ICD-10-CM

## 2013-09-19 NOTE — Progress Notes (Signed)
    Clinical Summary Christopher Austin is a 58 y.o.male last seen in November 2014. He reports no chest pain or progressive shortness of breath. He states that he has been taking his medications regularly.  Followup echocardiogram done in May 2014 demonstrated normalization of LV function, LVEF 55% without wall motion abnormalities.  Since I last saw him, he states that his daughter was going through a decline related to her medical health, he was under a lot of stress and started smoking again. His daughter is now doing better, he states he is trying to quit smoking, and feels like he should be able to do it. Fortunately, he has not returned to drinking alcohol.   No Known Allergies  Current Outpatient Prescriptions  Medication Sig Dispense Refill  . amLODipine (NORVASC) 5 MG tablet Take 5 mg by mouth daily.      . folic acid (FOLVITE) 1 MG tablet Take 1 mg by mouth daily.      Marland Kitchen HYDROcodone-acetaminophen (NORCO/VICODIN) 5-325 MG per tablet Take 1 tablet by mouth every 4 (four) hours as needed for moderate pain.  15 tablet  0  . ibuprofen (ADVIL,MOTRIN) 600 MG tablet Take 1 tablet (600 mg total) by mouth every 6 (six) hours as needed.  20 tablet  0  . ramipril (ALTACE) 5 MG capsule Take 1 capsule (5 mg total) by mouth daily.  30 capsule  6  . sildenafil (VIAGRA) 25 MG tablet Take 1 tablet (25 mg total) by mouth daily as needed for erectile dysfunction.  10 tablet  0  . thiamine (VITAMIN B-1) 100 MG tablet Take 100 mg by mouth daily.       No current facility-administered medications for this visit.    Past Medical History  Diagnosis Date  . Essential hypertension, benign   . 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 PSVT (paroxysmal supraventricular tachycardia)     Noted during exercise testing  . Bradycardia     No clear evidence of chronotropic incompetence  . Syncope     "Seizures" "25 years ago"  . Headache(784.0)   . Alcoholic   . Chronic pain     Social History Christopher Austin  reports that he has been smoking Cigarettes.  He has a 22 pack-year smoking history. He does not have any smokeless tobacco history on file. Christopher Austin reports that he drinks alcohol.  Review of Systems No palpitations, dizziness, syncope. Has had some tendinitis problems and is ankle. Otherwise negative.  Physical Examination Filed Vitals:   09/19/13 1003  BP: 122/64  Pulse: 51   Filed Weights   09/19/13 1003  Weight: 109 lb (49.442 kg)    Comfortable at rest.  HEENT: Conjunctiva and lids normal, oropharynx clear.  Neck: Supple, no elevated JVP or carotid bruits, no thyromegaly.  Lungs: Clear to auscultation, nonlabored breathing at rest.  Cardiac: Regular rate and rhythm, no S3 or significant systolic murmur, no pericardial rub.  Abdomen: Soft, nontender, bowel sounds present.  Extremities: No pitting edema, distal pulses 2+.    Problem List and Plan   Cardiomyopathy Symptomatically stable, normalization of LVEF on medical therapy and with the cessation of alcohol. Continue observation.  Essential hypertension, benign Normal blood pressure today.  PSVT (paroxysmal supraventricular tachycardia) Quiescent at this time. He is not on any rate lowering medications with baseline bradycardia.    Jonelle Sidle, M.D., F.A.C.C.

## 2013-09-19 NOTE — Assessment & Plan Note (Signed)
Normal blood pressure today. 

## 2013-09-19 NOTE — Patient Instructions (Addendum)
Your physician wants you to follow-up in: 6 months You will receive a reminder letter in the mail two months in advance. If you don't receive a letter, please call our office to schedule the follow-up appointment.    Your physician recommends that you continue on your current medications as directed. Please refer to the Current Medication list given to you today.   Thanks for choosing [facility] !  

## 2013-09-19 NOTE — Assessment & Plan Note (Signed)
Quiescent at this time. He is not on any rate lowering medications with baseline bradycardia.

## 2013-09-19 NOTE — Assessment & Plan Note (Signed)
Symptomatically stable, normalization of LVEF on medical therapy and with the cessation of alcohol. Continue observation.

## 2013-10-13 ENCOUNTER — Other Ambulatory Visit: Payer: Self-pay | Admitting: Cardiology

## 2013-11-30 ENCOUNTER — Other Ambulatory Visit (HOSPITAL_COMMUNITY): Payer: Self-pay | Admitting: *Deleted

## 2013-11-30 DIAGNOSIS — B182 Chronic viral hepatitis C: Secondary | ICD-10-CM

## 2013-12-05 ENCOUNTER — Ambulatory Visit (HOSPITAL_COMMUNITY)
Admission: RE | Admit: 2013-12-05 | Discharge: 2013-12-05 | Disposition: A | Discharge status: Home / Self Care | Payer: Medicaid Other | Source: Ambulatory Visit | Attending: *Deleted | Admitting: *Deleted

## 2013-12-05 DIAGNOSIS — B182 Chronic viral hepatitis C: Secondary | ICD-10-CM | POA: Diagnosis present

## 2014-03-20 ENCOUNTER — Ambulatory Visit (INDEPENDENT_AMBULATORY_CARE_PROVIDER_SITE_OTHER): Payer: Medicaid Other | Admitting: Cardiology

## 2014-03-20 ENCOUNTER — Encounter: Payer: Self-pay | Admitting: Cardiology

## 2014-03-20 VITALS — BP 112/62 | HR 58 | Ht 64.0 in | Wt 108.0 lb

## 2014-03-20 DIAGNOSIS — F17200 Nicotine dependence, unspecified, uncomplicated: Secondary | ICD-10-CM

## 2014-03-20 DIAGNOSIS — F17210 Nicotine dependence, cigarettes, uncomplicated: Secondary | ICD-10-CM | POA: Insufficient documentation

## 2014-03-20 DIAGNOSIS — I428 Other cardiomyopathies: Secondary | ICD-10-CM

## 2014-03-20 DIAGNOSIS — Z720 Tobacco use: Secondary | ICD-10-CM | POA: Insufficient documentation

## 2014-03-20 DIAGNOSIS — I10 Essential (primary) hypertension: Secondary | ICD-10-CM

## 2014-03-20 DIAGNOSIS — I429 Cardiomyopathy, unspecified: Secondary | ICD-10-CM

## 2014-03-20 DIAGNOSIS — I471 Supraventricular tachycardia: Secondary | ICD-10-CM

## 2014-03-20 NOTE — Patient Instructions (Signed)
Your physician wants you to follow-up in: 6 months with Dr. McDowell You will receive a reminder letter in the mail two months in advance. If you don't receive a letter, please call our office to schedule the follow-up appointment.  Your physician recommends that you continue on your current medications as directed. Please refer to the Current Medication list given to you today.  Thank you for choosing [facility]!!    

## 2014-03-20 NOTE — Assessment & Plan Note (Signed)
Relatively quiescent at this point. Sinus bradycardia at baseline.

## 2014-03-20 NOTE — Progress Notes (Signed)
    Clinical Summary Christopher Austin is a 58 y.o.male last seen in February. He is here with a friend today. Reports no change in breathing status, NYHA class II dyspnea, no chest pain or palpitations. He reports compliance with his medications.  He has gone back to smoking again, is contemplating another quit attempt however. He was successful using nicotine patches last time.  Followup echocardiogram done in May 2014 demonstrated normalization of LV function, LVEF 55% without wall motion abnormalities.  ECG today shows sinus bradycardia.   No Known Allergies  Current Outpatient Prescriptions  Medication Sig Dispense Refill  . amLODipine (NORVASC) 5 MG tablet Take 5 mg by mouth daily.      . folic acid (FOLVITE) 1 MG tablet Take 1 mg by mouth daily.      Marland Kitchen ibuprofen (ADVIL,MOTRIN) 600 MG tablet Take 1 tablet (600 mg total) by mouth every 6 (six) hours as needed.  20 tablet  0  . ramipril (ALTACE) 5 MG capsule TAKE ONE CAPSULE BY MOUTH DAILY  90 capsule  3  . thiamine (VITAMIN B-1) 100 MG tablet Take 100 mg by mouth daily.       No current facility-administered medications for this visit.    Past Medical History  Diagnosis Date  . Essential hypertension, benign   . 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 PSVT (paroxysmal supraventricular tachycardia)     Noted during exercise testing  . Bradycardia     No clear evidence of chronotropic incompetence  . Syncope     "Seizures" "25 years ago"  . Headache(784.0)   . Alcoholic   . Chronic pain     Social History Mr. Volek reports that he has been smoking Cigarettes.  He has a 22 pack-year smoking history. He does not have any smokeless tobacco history on file. Mr. Ki reports that he drinks alcohol.  Review of Systems Other systems reviewed and negative except as outlined.  Physical Examination Filed Vitals:   03/20/14 0853  BP: 112/62  Pulse: 58   Filed Weights   03/20/14 0853  Weight: 108 lb (48.988 kg)     Comfortable at rest.  HEENT: Conjunctiva and lids normal, oropharynx clear.  Neck: Supple, no elevated JVP or carotid bruits, no thyromegaly.  Lungs: Clear to auscultation, nonlabored breathing at rest.  Cardiac: Regular rate and rhythm, no S3 or significant systolic murmur, no pericardial rub.  Abdomen: Soft, nontender, bowel sounds present.  Extremities: No pitting edema, distal pulses 2+.  Skin: Warm and dry. Musculoskeletal: No kyphosis. Neuropsychiatric: Alert and oriented x3, affect appropriate.   Problem List and Plan   Cardiomyopathy Symptomatically stable, normalization of LVEF on medical therapy and with the cessation of alcohol. Continue observation.  PSVT (paroxysmal supraventricular tachycardia) Relatively quiescent at this point. Sinus bradycardia at baseline.  Essential hypertension, benign Blood pressure is normal today. No changes made.  Tobacco abuse We discussed smoking cessation strategies, and he is contemplating another quit attempt.    Jonelle Sidle, M.D., F.A.C.C.

## 2014-03-20 NOTE — Assessment & Plan Note (Signed)
Symptomatically stable, normalization of LVEF on medical therapy and with the cessation of alcohol. Continue observation. 

## 2014-03-20 NOTE — Assessment & Plan Note (Signed)
We discussed smoking cessation strategies, and he is contemplating another quit attempt.

## 2014-03-20 NOTE — Assessment & Plan Note (Signed)
Blood pressure is normal today. No changes made. 

## 2014-07-14 ENCOUNTER — Encounter (HOSPITAL_COMMUNITY): Payer: Self-pay | Admitting: Cardiology

## 2014-07-14 ENCOUNTER — Emergency Department (HOSPITAL_COMMUNITY)
Admission: EM | Admit: 2014-07-14 | Discharge: 2014-07-14 | Disposition: A | Discharge status: Home / Self Care | Payer: Medicaid Other | Attending: Emergency Medicine | Admitting: Emergency Medicine

## 2014-07-14 DIAGNOSIS — I10 Essential (primary) hypertension: Secondary | ICD-10-CM | POA: Insufficient documentation

## 2014-07-14 DIAGNOSIS — G8929 Other chronic pain: Secondary | ICD-10-CM | POA: Insufficient documentation

## 2014-07-14 DIAGNOSIS — M79605 Pain in left leg: Secondary | ICD-10-CM | POA: Diagnosis present

## 2014-07-14 DIAGNOSIS — M7661 Achilles tendinitis, right leg: Secondary | ICD-10-CM | POA: Diagnosis not present

## 2014-07-14 DIAGNOSIS — Z8659 Personal history of other mental and behavioral disorders: Secondary | ICD-10-CM | POA: Diagnosis not present

## 2014-07-14 DIAGNOSIS — Z720 Tobacco use: Secondary | ICD-10-CM | POA: Insufficient documentation

## 2014-07-14 MED ORDER — IBUPROFEN 800 MG PO TABS
800.0000 mg | ORAL_TABLET | Freq: Once | ORAL | Status: AC
  Administered 2014-07-14: 800 mg via ORAL
  Filled 2014-07-14: qty 1

## 2014-07-14 MED ORDER — IBUPROFEN 600 MG PO TABS: 600.0000 mg | ORAL_TABLET | Freq: Four times a day (QID) | ORAL | Status: DC | PRN

## 2014-07-14 MED ORDER — HYDROCODONE-ACETAMINOPHEN 5-325 MG PO TABS: 1.0000 | ORAL_TABLET | ORAL | Status: DC | PRN

## 2014-07-14 NOTE — ED Provider Notes (Signed)
CSN: [id number]     Arrival date & time 07/14/14  0744 History   First MD Initiated Contact with Patient 07/08/13 1422     Chief Complaint  Patient presents with  . Leg Pain   (Consider location/radiation/quality/duration/timing/severity/associated sxs/prior Treatment) HPI Comments: Christopher Austin is a 58 y.o. Male presenting with pain and swelling of his right distal achilles tendon area which started 3 days ago.  He denies injury to the area and denies any new exertion with the leg or new activities.  Pain is worsened with attempts to flex and extend the foot.  He is weight bearing but with discomfort.  He reports prior history of similar symptoms and was told it was gout (3-4 years ago) .  He was seen here one year ago with the same symptom which resolved after using ibuprofen and hydrocodone for several days.  He was referred to orthopedics but did not follow up when the problem resolved.  He has tried using an ace wrap which worsened his pain.  The history is provided by the patient.    Past Medical History  Diagnosis Date  . Essential hypertension, benign   . 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 PSVT (paroxysmal supraventricular tachycardia)     Noted during exercise testing  . Bradycardia     No clear evidence of chronotropic incompetence  . Syncope     "Seizures" "25 years ago"  . Headache(784.0)   . Alcoholic   . Chronic pain    Past Surgical History  Procedure Laterality Date  . Right hand surgery  2011    Abscess drainage  . Colonoscopy  05/19/2012    Procedure: COLONOSCOPY;  Surgeon: Corbin Adeobert M Rourk, MD;  Location: [facility];  Service: Endoscopy;  Laterality: N/A;  9:30 AM   Family History  Problem Relation Age of Onset  . Hypertension Mother   . Hyperlipidemia Mother    History  Substance Use Topics  . Smoking status: Current Every Day Smoker -- 1.00 packs/day for 22 years    Types: Cigarettes  . Smokeless tobacco: Not on file     Comment: started  back 05-2013  . Alcohol Use: Yes     Comment: quit 2 months ago then started again    Review of Systems  Constitutional: Negative for fever and chills.  Musculoskeletal: Positive for arthralgias. Negative for myalgias and joint swelling.  Skin: Negative for color change and wound.  Neurological: Negative for weakness and numbness.    Allergies  Review of patient's allergies indicates no known allergies.  Home Medications   Current Outpatient Rx  Name  Route  Sig  Dispense  Refill  . amLODipine (NORVASC) 5 MG tablet   Oral   Take 5 mg by mouth daily.         . folic acid (FOLVITE) 1 MG tablet   Oral   Take 1 mg by mouth daily.         Marland Kitchen. HYDROcodone-acetaminophen (NORCO/VICODIN) 5-325 MG per tablet   Oral   Take 1 tablet by mouth every 4 (four) hours as needed.   20 tablet   0   . ibuprofen (ADVIL,MOTRIN) 600 MG tablet   Oral   Take 1 tablet (600 mg total) by mouth every 6 (six) hours as needed.   30 tablet   0   . ramipril (ALTACE) 5 MG capsule      TAKE ONE CAPSULE BY MOUTH DAILY   90 capsule  3   . thiamine (VITAMIN B-1) 100 MG tablet   Oral   Take 100 mg by mouth daily.          BP 121/70 mmHg  Pulse 50  Temp(Src) 98.5 F (36.9 C) (Oral)  Resp 16  Ht 5\' 4"  (1.626 m)  Wt 103 lb (46.72 kg)  BMI 17.67 kg/m2  SpO2 98% Physical Exam  Constitutional: He appears well-developed and well-nourished.  HENT:  Head: Atraumatic.  Neck: Normal range of motion.  Cardiovascular:  Pulses equal bilaterally  Musculoskeletal: He exhibits tenderness.       Right foot: There is tenderness and swelling.       Feet:  Ttp, modest edema without erythema posterior distal mid achilles area.  Skin intact, no red streaking, no induration or ecchymosis, no signs of trauma.  Dorsalis pedis pulse intact.  Less than 3 sec cap refill in toes.  Nontender to palpation at right calcaneous insertion and gastroc muscles.    Neurological: He is alert. He has normal strength. He  displays normal reflexes. No sensory deficit.  Equal strength  Skin: Skin is warm and dry.  Psychiatric: He has a normal mood and affect.    ED Course  Procedures  (including critical care time) Labs Review Labs Reviewed - No data to display Imaging Review No results found.   EKG Interpretation  Date/Time:    Ventricular Rate:    PR Interval:    QRS Duration:   QT Interval:    QTC Calculation:   R Axis:     Text Interpretation:         MDM   1. Tendonitis, Achilles, right    Pt was prescribed ibuprofen, hydrocodone.   He reports will minimize use,  Cane if needed.  Will f/u with Dr. Romeo Apple next week -patient agrees with plan and will call for appt if not improving.      [name], PA-C 07/14/14 0830  [name], MD 07/14/14 [phone]

## 2014-07-14 NOTE — ED Notes (Signed)
Right lower leg pain times one week.  Denies any injury.  States he had this about a year ago and was diagnosed with achilles tendinitis.

## 2014-07-14 NOTE — Discharge Instructions (Signed)
Achilles Tendinitis Achilles tendinitis is inflammation of the tough, cord-like band that attaches the lower muscles of your leg to your heel (Achilles tendon). It is usually caused by overusing the tendon and joint involved.  CAUSES Achilles tendinitis can happen because of:  A sudden increase in exercise or activity (such as running).  Doing the same exercises or activities (such as jumping) over and over.  Not warming up calf muscles before exercising.  Exercising in shoes that are worn out or not made for exercise.  Having arthritis or a bone growth on the back of the heel bone. This can rub against the tendon and hurt the tendon. SIGNS AND SYMPTOMS The most common symptoms are:  Pain in the back of the leg, just above the heel. The pain usually gets worse with exercise and better with rest.  Stiffness or soreness in the back of the leg, especially in the morning.  Swelling of the skin over the Achilles tendon.  Trouble standing on tiptoe. Sometimes, an Achilles tendon tears (ruptures). Symptoms of an Achilles tendon rupture can include:  Sudden, severe pain in the back of the leg.  Trouble putting weight on the foot or walking normally. DIAGNOSIS Achilles tendinitis will be diagnosed based on symptoms and a physical examination. An X-ray may be done to check if another condition is causing your symptoms. An MRI may be ordered if your health care provider suspects you may have completely torn your tendon, which is called an Achilles tendon rupture.  TREATMENT  Achilles tendinitis usually gets better over time. It can take weeks to months to heal completely. Treatment focuses on treating the symptoms and helping the injury heal. HOME CARE INSTRUCTIONS   Rest your Achilles tendon and avoid activities that cause pain.  Apply ice to the injured area:  Put ice in a plastic bag.  Place a towel between your skin and the bag.  Leave the ice on for 20 minutes, 2-3 times a  day  Try to avoid using the tendon (other than gentle range of motion) while the tendon is painful. Do not resume use until instructed by your health care provider. Then begin use gradually. Do not increase use to the point of pain. If pain does develop, decrease use and continue the above measures. Gradually increase activities that do not cause discomfort until you achieve normal use.  Do exercises to make your calf muscles stronger and more flexible. Your health care provider or physical therapist can recommend exercises for you to do.  Wrap your ankle with an elastic bandage or other wrap. This can help keep your tendon from moving too much. Your health care provider will show you how to wrap your ankle correctly.  Only take over-the-counter or prescription medicines for pain, discomfort, or fever as directed by your health care provider. SEEK MEDICAL CARE IF:   Your pain and swelling increase or pain is uncontrolled with medicines.  You develop new, unexplained symptoms or your symptoms get worse.  You are unable to move your toes or foot.  You develop warmth and swelling in your foot.  You have an unexplained temperature. MAKE SURE YOU:   Understand these instructions.  Will watch your condition.  Will get help right away if you are not doing well or get worse. Document Released: 04/23/2005 Document Revised: 05/04/2013 Document Reviewed: 02/23/2013 [facility] Patient Information 2015 [place], [place]. This information is not intended to replace advice given to you by your health care provider. Make sure you discuss  any questions you have with your health care provider.  Rest your ankle, ice packs may help with pain and swelling.  You may take the hydrocodone prescribed for pain relief.  This will make you drowsy - do not drive within 4 hours of taking this medication.  The ibuprofen is the medicine that should help to heal this inflammation.  Call Dr Romeo Apple for further evaluation  if your symptoms do not improve.

## 2014-07-22 ENCOUNTER — Emergency Department (HOSPITAL_COMMUNITY): Payer: Medicaid Other

## 2014-07-22 ENCOUNTER — Encounter (HOSPITAL_COMMUNITY): Payer: Self-pay | Admitting: Emergency Medicine

## 2014-07-22 ENCOUNTER — Emergency Department (HOSPITAL_COMMUNITY)
Admission: EM | Admit: 2014-07-22 | Discharge: 2014-07-22 | Disposition: A | Discharge status: Home / Self Care | Payer: Medicaid Other | Attending: Emergency Medicine | Admitting: Emergency Medicine

## 2014-07-22 DIAGNOSIS — J069 Acute upper respiratory infection, unspecified: Secondary | ICD-10-CM | POA: Diagnosis not present

## 2014-07-22 DIAGNOSIS — Z79899 Other long term (current) drug therapy: Secondary | ICD-10-CM | POA: Insufficient documentation

## 2014-07-22 DIAGNOSIS — R05 Cough: Secondary | ICD-10-CM

## 2014-07-22 DIAGNOSIS — R059 Cough, unspecified: Secondary | ICD-10-CM

## 2014-07-22 DIAGNOSIS — I10 Essential (primary) hypertension: Secondary | ICD-10-CM | POA: Diagnosis not present

## 2014-07-22 DIAGNOSIS — G8929 Other chronic pain: Secondary | ICD-10-CM | POA: Insufficient documentation

## 2014-07-22 DIAGNOSIS — Z720 Tobacco use: Secondary | ICD-10-CM | POA: Insufficient documentation

## 2014-07-22 MED ORDER — AZITHROMYCIN 250 MG PO TABS: ORAL_TABLET | ORAL | Status: DC

## 2014-07-22 MED ORDER — BENZONATATE 100 MG PO CAPS: 200.0000 mg | ORAL_CAPSULE | Freq: Three times a day (TID) | ORAL | Status: DC | PRN

## 2014-07-22 MED ORDER — ALBUTEROL SULFATE HFA 108 (90 BASE) MCG/ACT IN AERS
2.0000 | INHALATION_SPRAY | Freq: Once | RESPIRATORY_TRACT | Status: AC
  Administered 2014-07-22: 2 via RESPIRATORY_TRACT
  Filled 2014-07-22: qty 6.7

## 2014-07-22 NOTE — Discharge Instructions (Signed)
Cough, Adult  A cough is a reflex that helps clear your throat and airways. It can help heal the body or may be a reaction to an irritated airway. A cough may only last 2 or 3 weeks (acute) or may last more than 8 weeks (chronic).  CAUSES Acute cough:  Viral or bacterial infections. Chronic cough:  Infections.  Allergies.  Asthma.  Post-nasal drip.  Smoking.  Heartburn or acid reflux.  Some medicines.  Chronic lung problems (COPD).  Cancer. SYMPTOMS   Cough.  Fever.  Chest pain.  Increased breathing rate.  High-pitched whistling sound when breathing (wheezing).  Colored mucus that you cough up (sputum). TREATMENT   A bacterial cough may be treated with antibiotic medicine.  A viral cough must run its course and will not respond to antibiotics.  Your caregiver may recommend other treatments if you have a chronic cough. HOME CARE INSTRUCTIONS   Only take over-the-counter or prescription medicines for pain, discomfort, or fever as directed by your caregiver. Use cough suppressants only as directed by your caregiver.  Use a cold steam vaporizer or humidifier in your bedroom or home to help loosen secretions.  Sleep in a semi-upright position if your cough is worse at night.  Rest as needed.  Stop smoking if you smoke. SEEK IMMEDIATE MEDICAL CARE IF:   You have pus in your sputum.  Your cough starts to worsen.  You cannot control your cough with suppressants and are losing sleep.  You begin coughing up blood.  You have difficulty breathing.  You develop pain which is getting worse or is uncontrolled with medicine.  You have a fever. MAKE SURE YOU:   Understand these instructions.  Will watch your condition.  Will get help right away if you are not doing well or get worse. Document Released: 01/10/2011 Document Revised: 10/06/2011 Document Reviewed: 01/10/2011 [employer] Patient Information 2015 [employer]. This information is not intended  to replace advice given to you by your health care provider. Make sure you discuss any questions you have with your health care provider.  

## 2014-07-22 NOTE — ED Notes (Signed)
Patient with no complaints at this time. Respirations even and unlabored. Skin warm/dry. Discharge instructions reviewed with patient at this time. Patient given opportunity to voice concerns/ask questions. Patient discharged at this time and left Emergency Department with steady gait.   

## 2014-07-25 NOTE — ED Provider Notes (Signed)
CSN: [id number]     Arrival date & time 07/22/14  6203 History   First MD Initiated Contact with Patient 07/22/14 [phone]     Chief Complaint  Patient presents with  . URI     (Consider location/radiation/quality/duration/timing/severity/associated sxs/prior Treatment) HPI  Christopher Austin is a 58 y.o. male who presents to the Emergency Department complaining of cough and nasal congestion for several days.  He reports the cough is occassionally productive of yellow sputum.  He also states that he has generalized body aches, fever at home is unknown.  States he has been taking OTC medications without relief.  He denies vomiting, abdominal pain, shortness of breath or sore throat.     Past Medical History  Diagnosis Date  . Essential hypertension, benign   . 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 PSVT (paroxysmal supraventricular tachycardia)     Noted during exercise testing  . Bradycardia     No clear evidence of chronotropic incompetence  . Syncope     "Seizures" "25 years ago"  . Headache(784.0)   . Alcoholic   . Chronic pain    Past Surgical History  Procedure Laterality Date  . Right hand surgery  2011    Abscess drainage  . Colonoscopy  05/19/2012    Procedure: COLONOSCOPY;  Surgeon: Corbin Ade, MD;  Location: [facility];  Service: Endoscopy;  Laterality: N/A;  9:30 AM   Family History  Problem Relation Age of Onset  . Hypertension Mother   . Hyperlipidemia Mother    History  Substance Use Topics  . Smoking status: Current Every Day Smoker -- 1.00 packs/day for 22 years    Types: Cigarettes  . Smokeless tobacco: Not on file     Comment: started back 05-2013  . Alcohol Use: Yes     Comment: quit 2 months ago then started again    Review of Systems  Constitutional: Negative for fever, chills, activity change and appetite change.  HENT: Positive for congestion, rhinorrhea and sneezing. Negative for facial swelling, sore throat and trouble swallowing.    Eyes: Negative for visual disturbance.  Respiratory: Positive for cough. Negative for chest tightness, shortness of breath, wheezing and stridor.   Gastrointestinal: Negative for nausea, vomiting and abdominal pain.  Genitourinary: Negative for dysuria.  Musculoskeletal: Positive for myalgias. Negative for neck pain and neck stiffness.  Skin: Negative.  Negative for rash.  Neurological: Negative for dizziness, weakness, numbness and headaches.  Hematological: Negative for adenopathy.  Psychiatric/Behavioral: Negative for confusion.  All other systems reviewed and are negative.     Allergies  Review of patient's allergies indicates no known allergies.  Home Medications   Prior to Admission medications   Medication Sig Start Date End Date Taking? Authorizing Provider  amLODipine (NORVASC) 5 MG tablet Take 5 mg by mouth daily.   Yes Historical Provider, MD  Chlorphen-Phenyleph-ASA (ALKA-SELTZER PLUS COLD PO) Take 2 capsules by mouth daily as needed (cold).   Yes Historical Provider, MD  folic acid (FOLVITE) 1 MG tablet Take 1 mg by mouth daily.   Yes Historical Provider, MD  HYDROcodone-acetaminophen (NORCO/VICODIN) 5-325 MG per tablet Take 1 tablet by mouth every 4 (four) hours as needed. Patient taking differently: Take 1 tablet by mouth every 4 (four) hours as needed for moderate pain.  07/14/14  Yes [name], PA-C  ibuprofen (ADVIL,MOTRIN) 600 MG tablet Take 1 tablet (600 mg total) by mouth every 6 (six) hours as needed. Patient taking differently: Take 600  mg by mouth every 6 (six) hours as needed for mild pain.  07/14/14  Yes Burgess AmorJulie Idol, PA-C  ramipril (ALTACE) 5 MG capsule TAKE ONE CAPSULE BY MOUTH DAILY 10/13/13  Yes Jonelle SidleSamuel G McDowell, MD  thiamine (VITAMIN B-1) 100 MG tablet Take 100 mg by mouth daily.   Yes Historical Provider, MD  azithromycin (ZITHROMAX) 250 MG tablet Take two tablets on day one, then one tab qd days 2-5 07/22/14   Ubaldo Daywalt L. Nadra Hritz, PA-C  benzonatate  (TESSALON) 100 MG capsule Take 2 capsules (200 mg total) by mouth 3 (three) times daily as needed for cough. Swallow whole, do not chew 07/22/14   Felcia Huebert L. Norman Bier, PA-C   BP 130/67 mmHg  Pulse 56  Temp(Src) 99.2 F (37.3 C) (Oral)  Resp 18  Ht 5\' 4"  (1.626 m)  Wt 109 lb (49.442 kg)  BMI 18.70 kg/m2  SpO2 97% Physical Exam  Constitutional: He is oriented to person, place, and time. He appears well-developed and well-nourished. No distress.  HENT:  Head: Normocephalic and atraumatic.  Right Ear: Tympanic membrane and ear canal normal.  Left Ear: Tympanic membrane and ear canal normal.  Nose: Mucosal edema and rhinorrhea present.  Mouth/Throat: Uvula is midline and mucous membranes are normal. No trismus in the jaw. No uvula swelling. Posterior oropharyngeal erythema present. No oropharyngeal exudate, posterior oropharyngeal edema or tonsillar abscesses.  Eyes: Conjunctivae are normal.  Neck: Normal range of motion and phonation normal. Neck supple. No Brudzinski's sign and no Kernig's sign noted.  Cardiovascular: Normal rate, regular rhythm, normal heart sounds and intact distal pulses.   No murmur heard. Pulmonary/Chest: Effort normal. No respiratory distress. He has wheezes. He has no rales. He exhibits no tenderness.  Few scattered expiratory wheezes and coarse lung sounds bilaterally.    Abdominal: Soft. He exhibits no distension. There is no tenderness. There is no rebound and no guarding.  Musculoskeletal: Normal range of motion. He exhibits no edema.  Lymphadenopathy:    He has no cervical adenopathy.  Neurological: He is alert and oriented to person, place, and time. He exhibits normal muscle tone. Coordination normal.  Skin: Skin is warm and dry.  Nursing note and vitals reviewed.   ED Course  Procedures (including critical care time) Labs Review Labs Reviewed - No data to display  Imaging Review Dg Chest 2 View  07/22/2014   CLINICAL DATA:  Acute congestion and  fever for 2 days  EXAM: CHEST  2 VIEW  COMPARISON:  05/19/2012  FINDINGS: The heart size and mediastinal contours are within normal limits. Both lungs are clear. The visualized skeletal structures are unremarkable.  IMPRESSION: No active cardiopulmonary disease.   Electronically Signed   By: Ruel Favorsrevor  Shick M.D.   On: 07/22/2014 11:41     EKG Interpretation None      MDM   Final diagnoses:  Cough  URI (upper respiratory infection)    Pt is well appearing, VSS.  XR is neg for PNA.  Lung sounds improved after albuterol, inhaler dispensed.  Pt agrees to close f/u with his PMD for recheck or return here if the sx's worsen.      Khalif Stender L. Trisha Mangleriplett, PA-C 07/25/14 0901  Flint MelterElliott L Wentz, MD 07/25/14 [phone]

## 2014-09-22 ENCOUNTER — Encounter: Payer: Medicaid Other | Admitting: Cardiology

## 2014-09-22 ENCOUNTER — Encounter: Payer: Self-pay | Admitting: Cardiology

## 2014-09-22 ENCOUNTER — Encounter: Payer: Self-pay | Admitting: *Deleted

## 2014-09-22 NOTE — Progress Notes (Signed)
No show  This encounter was created in error - please disregard.

## 2014-10-01 IMAGING — NM NM MYOCAR SINGLE W/SPECT W/WALL MOTION & EF
2 series · 12 of 12 positions shown · non-contrast
Comparison: none

Ordering Physician: ERMA CLAUSEN

[place] Physician: [REDACTED]al Data: 56-year-old male with hypertension, bradycardia, and
newly diagnosed cardiomyopathy.  The study is requested to evaluate
for the presence and extent of ischemia.
NUCLEAR MEDICINE STRESS MYOVIEW STUDY WITH SPECT AND LEFT
VENTRICULAR EJECTION FRACTION
Radionuclide Data: One-day rest/stress protocol performed with
[DATE] mCi of Rc-LLm Myoview.
Stress Data: The patient was exercised on a Bruce protocol for 6
minutes achieving a maximum workload of seven METS.  Heart rate
increased from 39 beats per minute up to 181 beats per minute which
exceeded 85% of the maximum age predicted heart rate response.
Peak blood pressure was 182/75.  He had no chest pain.  Occasional
to frequent PVCs were noted during exercise, although there were no
sustained arrhythmias.  He did have bursts of what appeared to be
PSVT representing his high heart rates, ultimately with resolution
without specific intervention.  There were no clearly diagnostic ST-
segment abnormalities.
EKG: Baseline tracing shows marked sinus bradycardia at 39 beats
per minute with LVH.
Scintigraphic Data: Analysis of the raw perfusion data finds good
radiotracer uptake.
Tomographic views obtained using the short axis, vertical long
axis, and horizontal long axis planes.  There is a relatively small
anteroseptal / apical defect that is fixed and suggestive of
attenuation.  There is also a small, moderate intensity defect in
the inferior wall, representing either scar or attenuation.  No
large ischemic zones are noted.
Gated imaging reveals an EDV of 133, ESV of 84, T I D ratio of
1.06, and LVEF of 37% with relatively global hypokinesis.

[Series 1: cs cardiac tc hi dose · 6.41mm/px · 6 of 512 frames shown]
[frame 43/512]
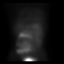
[frame 128/512]
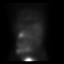
[frame 214/512]
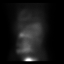
[frame 299/512]
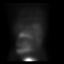
[frame 384/512]
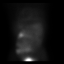
[frame 470/512]
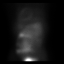

[Series 1: cr cardiac tc low dose · 6.41mm/px · 6 of 64 frames shown]
[frame 6/64]
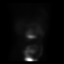
[frame 16/64]
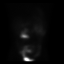
[frame 27/64]
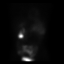
[frame 38/64]
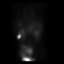
[frame 48/64]
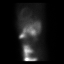
[frame 59/64]
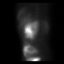

[12 of 12 positions shown; findings below may reference images not displayed]

IMPRESSION: Abnormal exercise Myoview.  There were no diagnostic ST-segment
abnormalities.  The patient did not have clear evidence of
chronotropic incompetence with adequate heart rate response.  He
did have bursts of what appears to be PSVT and occasional to
frequent ventricular ectopy.  Perfusion imaging shows evidence of
scar versus attenuation affecting a small portion of the
anteroseptum at the apex at a moderate sized inferior defect.  No
large ischemic zones are noted and the ejection fraction was 37%
with global hypokinesis.

## 2014-11-01 ENCOUNTER — Ambulatory Visit (INDEPENDENT_AMBULATORY_CARE_PROVIDER_SITE_OTHER): Payer: Medicaid Other | Admitting: Cardiology

## 2014-11-01 ENCOUNTER — Encounter: Payer: Self-pay | Admitting: Cardiology

## 2014-11-01 VITALS — BP 130/72 | HR 56 | Ht 64.0 in | Wt 104.0 lb

## 2014-11-01 DIAGNOSIS — I471 Supraventricular tachycardia: Secondary | ICD-10-CM | POA: Diagnosis not present

## 2014-11-01 DIAGNOSIS — I429 Cardiomyopathy, unspecified: Secondary | ICD-10-CM | POA: Diagnosis not present

## 2014-11-01 DIAGNOSIS — Z720 Tobacco use: Secondary | ICD-10-CM

## 2014-11-01 MED ORDER — AMLODIPINE BESYLATE 5 MG PO TABS: 5.0000 mg | ORAL_TABLET | Freq: Every day | ORAL | Status: DC

## 2014-11-01 MED ORDER — RAMIPRIL 5 MG PO CAPS: 5.0000 mg | ORAL_CAPSULE | Freq: Every day | ORAL | Status: DC

## 2014-11-01 NOTE — Patient Instructions (Signed)
Your physician wants you to follow-up in: 6 months with Dr.McDowell You will receive a reminder letter in the mail two months in advance. If you don't receive a letter, please call our office to schedule the follow-up appointment.     Your physician recommends that you continue on your current medications as directed. Please refer to the Current Medication list given to you today.     Thank you for choosing [facility] !        

## 2014-11-01 NOTE — Progress Notes (Signed)
Cardiology Office Note  Date: 11/01/2014   ID: Christopher Austin, DOB [birth year], MRN [id number]  PCP: Avon Gully, MD  Primary Cardiologist: Nona Dell, MD   Chief Complaint  Patient presents with  . Cardiomyopathy  . PSVT    History of Present Illness: Christopher Austin is a 59 y.o. male last seen in August 2015. He presents for a routine visit. He states that his breathing status is unchanged, NYHA class II dyspnea, also not having any palpitations or chest pain. No dizziness or syncope. We discussed his medications which are outlined below, he reports compliance. He also tells me that he is "staying out of trouble."  He did start smoking again after quitting previously, has not been able to stop again. We have discussed smoking cessation techniques. He states that he is not drinking alcohol to excess.   Past Medical History  Diagnosis Date  . Essential hypertension, benign   . 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 PSVT (paroxysmal supraventricular tachycardia)     Noted during exercise testing  . Bradycardia     No clear evidence of chronotropic incompetence  . History of seizures   . Headache(784.0)   . History of alcohol abuse   . Chronic pain      Current Outpatient Prescriptions  Medication Sig Dispense Refill  . amLODipine (NORVASC) 5 MG tablet Take 5 mg by mouth daily.    . folic acid (FOLVITE) 1 MG tablet Take 1 mg by mouth daily.    Marland Kitchen HYDROcodone-acetaminophen (NORCO/VICODIN) 5-325 MG per tablet Take 1 tablet by mouth every 4 (four) hours as needed. (Patient taking differently: Take 1 tablet by mouth every 4 (four) hours as needed for moderate pain. ) 20 tablet 0  . ramipril (ALTACE) 5 MG capsule TAKE ONE CAPSULE BY MOUTH DAILY 90 capsule 3  . thiamine (VITAMIN B-1) 100 MG tablet Take 100 mg by mouth daily.     No current facility-administered medications for this visit.    Allergies:  Review of patient's allergies indicates no known  allergies.   Social History: The patient  reports that he has been smoking Cigarettes.  He started smoking about 42 years ago. He has a 22 pack-year smoking history. He does not have any smokeless tobacco history on file. He reports that he drinks alcohol. He reports that he does not use illicit drugs.   ROS:  Please see the history of present illness. Otherwise, complete review of systems is positive for none.  All other systems are reviewed and negative.   Physical Exam: VS:  BP 130/72 mmHg  Pulse 56  Ht  (1.626 m)  Wt 104 lb (47.174 kg)  BMI 17.84 kg/m2  SpO2 95%, BMI Body mass index is 17.84 kg/(m^2).  Wt Readings from Last 3 Encounters:  11/01/14 104 lb (47.174 kg)  07/22/14 109 lb (49.442 kg)  07/14/14 103 lb (46.72 kg)     Comfortable at rest.  HEENT: Conjunctiva and lids normal, oropharynx clear.  Neck: Supple, no elevated JVP or carotid bruits, no thyromegaly.  Lungs: Clear to auscultation, nonlabored breathing at rest.  Cardiac: Regular rate and rhythm, no S3 or significant systolic murmur, no pericardial rub.  Abdomen: Soft, nontender, bowel sounds present.  Extremities: No pitting edema, distal pulses 2+.  Skin: Warm and dry. Musculoskeletal: No kyphosis. Neuropsychiatric: Alert and oriented x3, affect appropriate.   ECG: ECG is not ordered today.   Other Studies Reviewed Today:  Echocardiogram 12/17/2012: Study Conclusions  -  Left ventricle: The cavity size was normal. Wall thickness was normal. Systolic function was normal. The estimated ejection fraction was 55%. Wall motion was normal; there were no regional wall motion abnormalities. - Atrial septum: No defect or patent foramen ovale was identified. Impressions:  - Compared to the prior study performed 05/19/12, LV systolic function improved. Estimated EF has increased from 35-40% to 55%. LV size has also normalized.   ASSESSMENT AND PLAN:  1. History of nonischemic  cardiomyopathy in the setting of previous alcohol abuse and also recurring SVT. He had normalization of LVEF as of 2014, and we continue medical therapy which is been somewhat limited by resting bradycardia. Follow-up arranged over the next 6 months.  2. PSVT, no recurring palpitations. He is not on beta blocker with resting bradycardia. This has also settled down by limiting alcohol.  3. History of alcohol abuse, patient states he is not drinking to excess.  4. Tobacco abuse, we continue to discuss smoking cessation.  Current medicines were reviewed at length with the patient today.   Disposition: FU with me in 6 months.   Signed, Jonelle Sidle, MD, [facility] 11/01/2014 9:03 AM    [facility] [address] [place], [place] [address] Phone: [phone]; Fax: [phone]

## 2014-12-15 ENCOUNTER — Emergency Department (HOSPITAL_COMMUNITY)
Admission: EM | Admit: 2014-12-15 | Discharge: 2014-12-15 | Disposition: A | Discharge status: Home / Self Care | Payer: Medicaid Other | Attending: Emergency Medicine | Admitting: Emergency Medicine

## 2014-12-15 ENCOUNTER — Encounter (HOSPITAL_COMMUNITY): Payer: Self-pay | Admitting: Emergency Medicine

## 2014-12-15 DIAGNOSIS — R51 Headache: Secondary | ICD-10-CM | POA: Diagnosis not present

## 2014-12-15 DIAGNOSIS — R519 Headache, unspecified: Secondary | ICD-10-CM

## 2014-12-15 DIAGNOSIS — I10 Essential (primary) hypertension: Secondary | ICD-10-CM | POA: Diagnosis not present

## 2014-12-15 DIAGNOSIS — Z79899 Other long term (current) drug therapy: Secondary | ICD-10-CM | POA: Insufficient documentation

## 2014-12-15 DIAGNOSIS — G8929 Other chronic pain: Secondary | ICD-10-CM | POA: Diagnosis not present

## 2014-12-15 DIAGNOSIS — L02213 Cutaneous abscess of chest wall: Secondary | ICD-10-CM | POA: Diagnosis not present

## 2014-12-15 DIAGNOSIS — R5383 Other fatigue: Secondary | ICD-10-CM | POA: Insufficient documentation

## 2014-12-15 DIAGNOSIS — Z720 Tobacco use: Secondary | ICD-10-CM | POA: Diagnosis not present

## 2014-12-15 DIAGNOSIS — L0291 Cutaneous abscess, unspecified: Secondary | ICD-10-CM

## 2014-12-15 MED ORDER — DEXAMETHASONE SODIUM PHOSPHATE 4 MG/ML IJ SOLN
8.0000 mg | Freq: Once | INTRAMUSCULAR | Status: AC
  Administered 2014-12-15: 8 mg via INTRAMUSCULAR
  Filled 2014-12-15: qty 2

## 2014-12-15 MED ORDER — ACETAMINOPHEN-CODEINE #3 300-30 MG PO TABS
2.0000 | ORAL_TABLET | Freq: Once | ORAL | Status: AC
Start: 2014-12-15 — End: 2014-12-15
  Administered 2014-12-15: 2 via ORAL
  Filled 2014-12-15: qty 2

## 2014-12-15 MED ORDER — CEFTRIAXONE SODIUM 1 G IJ SOLR
1.0000 g | Freq: Once | INTRAMUSCULAR | Status: AC
  Administered 2014-12-15: 1 g via INTRAMUSCULAR
  Filled 2014-12-15: qty 10

## 2014-12-15 MED ORDER — LIDOCAINE HCL (PF) 1 % IJ SOLN
INTRAMUSCULAR | Status: AC
  Administered 2014-12-15: 22:00:00
  Filled 2014-12-15: qty 5

## 2014-12-15 MED ORDER — ACETAMINOPHEN-CODEINE #3 300-30 MG PO TABS: 1.0000 | ORAL_TABLET | Freq: Four times a day (QID) | ORAL | Status: DC | PRN

## 2014-12-15 MED ORDER — PROMETHAZINE HCL 12.5 MG PO TABS
12.5000 mg | ORAL_TABLET | Freq: Once | ORAL | Status: AC
  Administered 2014-12-15: 12.5 mg via ORAL
  Filled 2014-12-15: qty 1

## 2014-12-15 NOTE — ED Provider Notes (Signed)
CSN: [id number]     Arrival date & time 12/15/14  2112 History   None    Chief Complaint  Patient presents with  . Headache     (Consider location/radiation/quality/duration/timing/severity/associated sxs/prior Treatment) HPI Comments: Patient is a 59 year old male who presents to the emergency department with a complaint of headache, and chills.  The patient states that Saturday, May 14, or Sunday, May 15 he sustained a bite he believes from an insect at his left axilla. He states that he noted increased redness and itching area he scratched the area. And the next day he had increased redness. He was seen by his primary physician was placed on Septra. The patient states he has been on Septra for 3 days. He states he now has sensation that he has chills, with being hot and then cold. He complains of headache, and generally didn't feel any better, he states he may even feel worse. The patient states he does not recall seeing a tick or taking a tick off of him, but is unsure of what is under the scab of the raised area in his axilla. He has not measured a temperature elevation since this started. He has not noticed any unusual rash. He has not had nausea, vomiting, or diarrhea. There's been no stiff neck or hot joints reported.  Patient is a 59 y.o. male presenting with headaches. The history is provided by the patient.  Headache Associated symptoms: fatigue     Past Medical History  Diagnosis Date  . Essential hypertension, benign   . 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 PSVT (paroxysmal supraventricular tachycardia)     Noted during exercise testing  . Bradycardia     No clear evidence of chronotropic incompetence  . History of seizures   . Headache(784.0)   . History of alcohol abuse   . Chronic pain    Past Surgical History  Procedure Laterality Date  . Right hand surgery  2011    Abscess drainage  . Colonoscopy  05/19/2012    Procedure: COLONOSCOPY;  Surgeon: Corbin Ade, MD;  Location: [facility];  Service: Endoscopy;  Laterality: N/A;  9:30 AM   Family History  Problem Relation Age of Onset  . Hypertension Mother   . Hyperlipidemia Mother    History  Substance Use Topics  . Smoking status: Current Every Day Smoker -- 1.00 packs/day for 22 years    Types: Cigarettes    Start date: 07/28/1972  . Smokeless tobacco: Not on file     Comment: started back 05-2013  . Alcohol Use: 0.0 oz/week    0 Standard drinks or equivalent per week     Comment: quit 2 months ago then started again    Review of Systems  Constitutional: Positive for chills and fatigue.  Skin: Positive for wound.  Neurological: Positive for headaches.  All other systems reviewed and are negative.     Allergies  Review of patient's allergies indicates no known allergies.  Home Medications   Prior to Admission medications   Medication Sig Start Date End Date Taking? Authorizing Provider  acetaminophen-codeine (TYLENOL #3) 300-30 MG per tablet Take 1-2 tablets by mouth every 6 (six) hours as needed for moderate pain. Please take this medication with food 12/15/14   Ivery Quale, PA-C  amLODipine (NORVASC) 5 MG tablet Take 1 tablet (5 mg total) by mouth daily. 11/01/14   Jonelle Sidle, MD  folic acid (FOLVITE) 1 MG tablet Take 1 mg  by mouth daily.    Historical Provider, MD  HYDROcodone-acetaminophen (NORCO/VICODIN) 5-325 MG per tablet Take 1 tablet by mouth every 4 (four) hours as needed. Patient taking differently: Take 1 tablet by mouth every 4 (four) hours as needed for moderate pain.  07/14/14   [name], PA-C  ramipril (ALTACE) 5 MG capsule Take 1 capsule (5 mg total) by mouth daily. 11/01/14   Jonelle Sidle, MD  thiamine (VITAMIN B-1) 100 MG tablet Take 100 mg by mouth daily.    Historical Provider, MD   BP 137/72 mmHg  Pulse 74  Temp(Src) 98.9 F (37.2 C) (Oral)  Resp 20  Ht 5\' 4"  (1.626 m)  Wt 103 lb (46.72 kg)  BMI 17.67 kg/m2  SpO2 99% Physical  Exam  Constitutional: He is oriented to person, place, and time. He appears well-developed and well-nourished.  Non-toxic appearance.  HENT:  Head: Normocephalic.  Right Ear: Tympanic membrane and external ear normal.  Left Ear: Tympanic membrane and external ear normal.  Eyes: EOM and lids are normal. Pupils are equal, round, and reactive to light.  Neck: Normal range of motion. Neck supple. Carotid bruit is not present.  Cardiovascular: Normal rate, regular rhythm, normal heart sounds, intact distal pulses and normal pulses.   Pulmonary/Chest: Breath sounds normal. No respiratory distress.  There is a small raised area of the upper outer left chest adjacent to the axilla. There is a scab in place. The area is warm but not hot. There is no red streaking appreciated. I examined the scabbed area under magnification, but I see no evidence of a portion of insect in this area.  Abdominal: Soft. Bowel sounds are normal. There is no tenderness. There is no guarding.  Musculoskeletal: Normal range of motion.  There is full range of motion of both upper extremities. No hot joints appreciated.  Lymphadenopathy:       Head (right side): No submandibular adenopathy present.       Head (left side): No submandibular adenopathy present.    He has no cervical adenopathy.  Neurological: He is alert and oriented to person, place, and time. He has normal strength. No cranial nerve deficit or sensory deficit.  Skin: Skin is warm and dry.  Psychiatric: He has a normal mood and affect. His speech is normal.  Nursing note and vitals reviewed.   ED Course  Procedures (including critical care time) Labs Review Labs Reviewed - No data to display  Imaging Review No results found.   EKG Interpretation None      MDM  No tachycardia present. No drainage or red streaks from the bite area consistent with cellulitis. No hot joints or neck stiffness appreciated.  Patient will be treated with intramuscular  Rocephin and intramuscular Decadron. Patient is asked to continue his current medications including his Septra. Prescription for Tylenol codeine No. 15 tablets given to the patient to assist with his headache. Patient will follow-up with Dr. Letitia Neri office next week. He is invited to return to the emergency department if any emergent changes, problems, or concerns.    Final diagnoses:  Headache, unspecified headache type  Abscess    **I have reviewed nursing notes, vital signs, and all appropriate lab and imaging results for this patient.Ivery Quale, PA-C 12/15/14 2213  Linwood Dibbles, MD 12/15/14 [phone]

## 2014-12-15 NOTE — ED Notes (Signed)
Patient states was bitten by something last week and went to see his doctor and was given an antibiotic.  Patient states he isn't feeling any better.

## 2014-12-15 NOTE — Discharge Instructions (Signed)
Your vital signs are stable. Please soak the abscess area and warm salt water daily. Please continue your medications. May use Tylenol codeine for headache not improved by Tylenol or ibuprofen. Please follow-up with Dr. Felecia Shelling in the office. You were treated tonight with intramuscular Rocephin, and Decadron. Abscess An abscess (boil or furuncle) is an infected area on or under the skin. This area is filled with yellowish-white fluid (pus) and other material (debris). HOME CARE   Only take medicines as told by your doctor.  If you were given antibiotic medicine, take it as directed. Finish the medicine even if you start to feel better.  If gauze is used, follow your doctor's directions for changing the gauze.  To avoid spreading the infection:  Keep your abscess covered with a bandage.  Wash your hands well.  Do not share personal care items, towels, or whirlpools with others.  Avoid skin contact with others.  Keep your skin and clothes clean around the abscess.  Keep all doctor visits as told. GET HELP RIGHT AWAY IF:   You have more pain, puffiness (swelling), or redness in the wound site.  You have more fluid or blood coming from the wound site.  You have muscle aches, chills, or you feel sick.  You have a fever. MAKE SURE YOU:   Understand these instructions.  Will watch your condition.  Will get help right away if you are not doing well or get worse. Document Released: 12/31/2007 Document Revised: 01/13/2012 Document Reviewed: 09/26/2011 [facility] Patient Information 2015 [place], [place]. This information is not intended to replace advice given to you by your health care provider. Make sure you discuss any questions you have with your health care provider.  Headaches, Frequently Asked Questions MIGRAINE HEADACHES Q: What is migraine? What causes it? How can I treat it? A: Generally, migraine headaches begin as a dull ache. Then they develop into a constant, throbbing,  and pulsating pain. You may experience pain at the temples. You may experience pain at the front or back of one or both sides of the head. The pain is usually accompanied by a combination of:  Nausea.  Vomiting.  Sensitivity to light and noise. Some people (about 15%) experience an aura (see below) before an attack. The cause of migraine is believed to be chemical reactions in the brain. Treatment for migraine may include over-the-counter or prescription medications. It may also include self-help techniques. These include relaxation training and biofeedback.  Q: What is an aura? A: About 15% of people with migraine get an "aura". This is a sign of neurological symptoms that occur before a migraine headache. You may see wavy or jagged lines, dots, or flashing lights. You might experience tunnel vision or blind spots in one or both eyes. The aura can include visual or auditory hallucinations (something imagined). It may include disruptions in smell (such as strange odors), taste or touch. Other symptoms include:  Numbness.  A "pins and needles" sensation.  Difficulty in recalling or speaking the correct word. These neurological events may last as long as 60 minutes. These symptoms will fade as the headache begins. Q: What is a trigger? A: Certain physical or environmental factors can lead to or "trigger" a migraine. These include:  Foods.  Hormonal changes.  Weather.  Stress. It is important to remember that triggers are different for everyone. To help prevent migraine attacks, you need to figure out which triggers affect you. Keep a headache diary. This is a good way to track  triggers. The diary will help you talk to your healthcare professional about your condition. Q: Does weather affect migraines? A: Bright sunshine, hot, humid conditions, and drastic changes in barometric pressure may lead to, or "trigger," a migraine attack in some people. But studies have shown that weather does not  act as a trigger for everyone with migraines. Q: What is the link between migraine and hormones? A: Hormones start and regulate many of your body's functions. Hormones keep your body in balance within a constantly changing environment. The levels of hormones in your body are unbalanced at times. Examples are during menstruation, pregnancy, or menopause. That can lead to a migraine attack. In fact, about three quarters of all women with migraine report that their attacks are related to the menstrual cycle.  Q: Is there an increased risk of stroke for migraine sufferers? A: The likelihood of a migraine attack causing a stroke is very remote. That is not to say that migraine sufferers cannot have a stroke associated with their migraines. In persons under age 79, the most common associated factor for stroke is migraine headache. But over the course of a person's normal life span, the occurrence of migraine headache may actually be associated with a reduced risk of dying from cerebrovascular disease due to stroke.  Q: What are acute medications for migraine? A: Acute medications are used to treat the pain of the headache after it has started. Examples over-the-counter medications, NSAIDs, ergots, and triptans.  Q: What are the triptans? A: Triptans are the newest class of abortive medications. They are specifically targeted to treat migraine. Triptans are vasoconstrictors. They moderate some chemical reactions in the brain. The triptans work on receptors in your brain. Triptans help to restore the balance of a neurotransmitter called serotonin. Fluctuations in levels of serotonin are thought to be a main cause of migraine.  Q: Are over-the-counter medications for migraine effective? A: Over-the-counter, or "OTC," medications may be effective in relieving mild to moderate pain and associated symptoms of migraine. But you should see your caregiver before beginning any treatment regimen for migraine.  Q: What are  preventive medications for migraine? A: Preventive medications for migraine are sometimes referred to as "prophylactic" treatments. They are used to reduce the frequency, severity, and length of migraine attacks. Examples of preventive medications include antiepileptic medications, antidepressants, beta-blockers, calcium channel blockers, and NSAIDs (nonsteroidal anti-inflammatory drugs). Q: Why are anticonvulsants used to treat migraine? A: During the past few years, there has been an increased interest in antiepileptic drugs for the prevention of migraine. They are sometimes referred to as "anticonvulsants". Both epilepsy and migraine may be caused by similar reactions in the brain.  Q: Why are antidepressants used to treat migraine? A: Antidepressants are typically used to treat people with depression. They may reduce migraine frequency by regulating chemical levels, such as serotonin, in the brain.  Q: What alternative therapies are used to treat migraine? A: The term "alternative therapies" is often used to describe treatments considered outside the scope of conventional Western medicine. Examples of alternative therapy include acupuncture, acupressure, and yoga. Another common alternative treatment is herbal therapy. Some herbs are believed to relieve headache pain. Always discuss alternative therapies with your caregiver before proceeding. Some herbal products contain arsenic and other toxins. TENSION HEADACHES Q: What is a tension-type headache? What causes it? How can I treat it? A: Tension-type headaches occur randomly. They are often the result of temporary stress, anxiety, fatigue, or anger. Symptoms include soreness in your temples,  a tightening band-like sensation around your head (a "vice-like" ache). Symptoms can also include a pulling feeling, pressure sensations, and contracting head and neck muscles. The headache begins in your forehead, temples, or the back of your head and neck.  Treatment for tension-type headache may include over-the-counter or prescription medications. Treatment may also include self-help techniques such as relaxation training and biofeedback. CLUSTER HEADACHES Q: What is a cluster headache? What causes it? How can I treat it? A: Cluster headache gets its name because the attacks come in groups. The pain arrives with little, if any, warning. It is usually on one side of the head. A tearing or bloodshot eye and a runny nose on the same side of the headache may also accompany the pain. Cluster headaches are believed to be caused by chemical reactions in the brain. They have been described as the most severe and intense of any headache type. Treatment for cluster headache includes prescription medication and oxygen. SINUS HEADACHES Q: What is a sinus headache? What causes it? How can I treat it? A: When a cavity in the bones of the face and skull (a sinus) becomes inflamed, the inflammation will cause localized pain. This condition is usually the result of an allergic reaction, a tumor, or an infection. If your headache is caused by a sinus blockage, such as an infection, you will probably have a fever. An x-ray will confirm a sinus blockage. Your caregiver's treatment might include antibiotics for the infection, as well as antihistamines or decongestants.  REBOUND HEADACHES Q: What is a rebound headache? What causes it? How can I treat it? A: A pattern of taking acute headache medications too often can lead to a condition known as "rebound headache." A pattern of taking too much headache medication includes taking it more than 2 days per week or in excessive amounts. That means more than the label or a caregiver advises. With rebound headaches, your medications not only stop relieving pain, they actually begin to cause headaches. Doctors treat rebound headache by tapering the medication that is being overused. Sometimes your caregiver will gradually substitute a  different type of treatment or medication. Stopping may be a challenge. Regularly overusing a medication increases the potential for serious side effects. Consult a caregiver if you regularly use headache medications more than 2 days per week or more than the label advises. ADDITIONAL QUESTIONS AND ANSWERS Q: What is biofeedback? A: Biofeedback is a self-help treatment. Biofeedback uses special equipment to monitor your body's involuntary physical responses. Biofeedback monitors:  Breathing.  Pulse.  Heart rate.  Temperature.  Muscle tension.  Brain activity. Biofeedback helps you refine and perfect your relaxation exercises. You learn to control the physical responses that are related to stress. Once the technique has been mastered, you do not need the equipment any more. Q: Are headaches hereditary? A: Four out of five (80%) of people that suffer report a family history of migraine. Scientists are not sure if this is genetic or a family predisposition. Despite the uncertainty, a child has a 50% chance of having migraine if one parent suffers. The child has a 75% chance if both parents suffer.  Q: Can children get headaches? A: By the time they reach high school, most young people have experienced some type of headache. Many safe and effective approaches or medications can prevent a headache from occurring or stop it after it has begun.  Q: What type of doctor should I see to diagnose and treat my headache? A: Start  with your primary caregiver. Discuss his or her experience and approach to headaches. Discuss methods of classification, diagnosis, and treatment. Your caregiver may decide to recommend you to a headache specialist, depending upon your symptoms or other physical conditions. Having diabetes, allergies, etc., may require a more comprehensive and inclusive approach to your headache. The National Headache Foundation will provide, upon request, a list of [facility] physician members in your  state. Document Released: 10/04/2003 Document Revised: 10/06/2011 Document Reviewed: 03/13/2008 [facility] Patient Information 2015 [place], [place]. This information is not intended to replace advice given to you by your health care provider. Make sure you discuss any questions you have with your health care provider.

## 2014-12-15 NOTE — ED Notes (Signed)
Pt had insect bite to lt axilla, seen by Dr Felecia Shelling for this and given septra.  Now having headache, feels he has a fever, then cold feeling.

## 2015-02-13 ENCOUNTER — Emergency Department (HOSPITAL_COMMUNITY)
Admission: EM | Admit: 2015-02-13 | Discharge: 2015-02-13 | Disposition: A | Discharge status: Home / Self Care | Payer: Medicaid Other | Attending: Emergency Medicine | Admitting: Emergency Medicine

## 2015-02-13 ENCOUNTER — Encounter (HOSPITAL_COMMUNITY): Payer: Self-pay | Admitting: Emergency Medicine

## 2015-02-13 DIAGNOSIS — Z720 Tobacco use: Secondary | ICD-10-CM | POA: Insufficient documentation

## 2015-02-13 DIAGNOSIS — G8929 Other chronic pain: Secondary | ICD-10-CM | POA: Insufficient documentation

## 2015-02-13 DIAGNOSIS — Z79899 Other long term (current) drug therapy: Secondary | ICD-10-CM | POA: Diagnosis not present

## 2015-02-13 DIAGNOSIS — H6121 Impacted cerumen, right ear: Secondary | ICD-10-CM | POA: Insufficient documentation

## 2015-02-13 DIAGNOSIS — Z8679 Personal history of other diseases of the circulatory system: Secondary | ICD-10-CM | POA: Insufficient documentation

## 2015-02-13 DIAGNOSIS — H6122 Impacted cerumen, left ear: Secondary | ICD-10-CM

## 2015-02-13 DIAGNOSIS — I10 Essential (primary) hypertension: Secondary | ICD-10-CM | POA: Insufficient documentation

## 2015-02-13 DIAGNOSIS — L237 Allergic contact dermatitis due to plants, except food: Secondary | ICD-10-CM

## 2015-02-13 DIAGNOSIS — L255 Unspecified contact dermatitis due to plants, except food: Secondary | ICD-10-CM | POA: Diagnosis not present

## 2015-02-13 DIAGNOSIS — R21 Rash and other nonspecific skin eruption: Secondary | ICD-10-CM | POA: Diagnosis present

## 2015-02-13 MED ORDER — DEXAMETHASONE SODIUM PHOSPHATE 4 MG/ML IJ SOLN: 8.0000 mg | Freq: Once | INTRAMUSCULAR | Status: DC

## 2015-02-13 MED ORDER — DEXAMETHASONE SODIUM PHOSPHATE 4 MG/ML IJ SOLN
8.0000 mg | Freq: Once | INTRAMUSCULAR | Status: AC
  Administered 2015-02-13: 8 mg via INTRAMUSCULAR
  Filled 2015-02-13: qty 2

## 2015-02-13 MED ORDER — PREDNISONE 50 MG PO TABS: ORAL_TABLET | ORAL | Status: DC

## 2015-02-13 NOTE — ED Provider Notes (Signed)
CSN: [id number]     Arrival date & time 02/13/15  2053 History  This chart was scribed for Donnetta Hutching, MD by Budd Palmer, ED Scribe. This patient was seen in room APA18/[facility]18 and the patient's care was started at 9:22 PM.    Chief Complaint  Patient presents with  . Rash   Treatment) The history is provided by the patient. No language interpreter was used.   HPI Comments: Christopher Austin is a 59 y.o. male who presents to the Emergency Department complaining of a red, itchy, spreading rash on his face, neck and arms onset 5 days ago. He reports exposure to poison oak. He has put on calamine lotion with mild relief. He has had a similar previous episode.  He also c/o his right ear being "stopped up." He states that this is a recurrent issue. Pt notes that when he was little he stuck the tooth of a comb in the right ear canal. He has tried an "ear pump" with no relief.  Past Medical History  Diagnosis Date  . Essential hypertension, benign   . 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 PSVT (paroxysmal supraventricular tachycardia)     Noted during exercise testing  . Bradycardia     No clear evidence of chronotropic incompetence  . History of seizures   . Headache(784.0)   . History of alcohol abuse   . Chronic pain    Past Surgical History  Procedure Laterality Date  . Right hand surgery  2011    Abscess drainage  . Colonoscopy  05/19/2012    Procedure: COLONOSCOPY;  Surgeon: Corbin Ade, MD;  Location: [facility];  Service: Endoscopy;  Laterality: N/A;  9:30 AM   Family History  Problem Relation Age of Onset  . Hypertension Mother   . Hyperlipidemia Mother    History  Substance Use Topics  . Smoking status: Current Every Day Smoker -- 1.00 packs/day for 22 years    Types: Cigarettes    Start date: 07/28/1972  . Smokeless tobacco: Not on file     Comment: started back 05-2013  . Alcohol Use: 0.0 oz/week    0 Standard drinks or equivalent per week      Comment: quit 2 months ago then started again    Review of Systems  Skin: Positive for rash.  All other systems reviewed and are negative.   Allergies  Review of patient's allergies indicates no known allergies.  Home Medications   Prior to Admission medications   Medication Sig Start Date End Date Taking? Authorizing Provider  amLODipine (NORVASC) 5 MG tablet Take 1 tablet (5 mg total) by mouth daily. 11/01/14  Yes Jonelle Sidle, MD  folic acid (FOLVITE) 1 MG tablet Take 1 mg by mouth daily.   Yes Historical Provider, MD  HYDROcodone-acetaminophen (NORCO/VICODIN) 5-325 MG per tablet Take 1 tablet by mouth every 4 (four) hours as needed. Patient taking differently: Take 1 tablet by mouth every 4 (four) hours as needed for moderate pain.  07/14/14  Yes Raynelle Fanning Idol, PA-C  ramipril (ALTACE) 5 MG capsule Take 1 capsule (5 mg total) by mouth daily. 11/01/14  Yes Jonelle Sidle, MD  thiamine (VITAMIN B-1) 100 MG tablet Take 100 mg by mouth daily.   Yes Historical Provider, MD  acetaminophen-codeine (TYLENOL #3) 300-30 MG per tablet Take 1-2 tablets by mouth every 6 (six) hours as needed for moderate pain. Please take this medication with food Patient not taking: Reported on  02/13/2015 12/15/14   Ivery Quale, PA-C  predniSONE (DELTASONE) 50 MG tablet 1 tablet daily for 7 days 02/13/15   Donnetta Hutching, MD   BP 133/67 mmHg  Pulse 56  Temp(Src) 98.4 F (36.9 C) (Oral)  Resp 18  Ht 5\' 3"  (1.6 m)  Wt 103 lb (46.72 kg)  BMI 18.25 kg/m2  SpO2 100% Physical Exam  Constitutional: He is oriented to person, place, and time. He appears well-developed and well-nourished.  HENT:  Head: Normocephalic and atraumatic.  Cerumen in right ear  Eyes: Conjunctivae and EOM are normal. Pupils are equal, round, and reactive to light.  Neck: Normal range of motion. Neck supple.  Cardiovascular: Normal rate and regular rhythm.   Pulmonary/Chest: Effort normal and breath sounds normal.  Abdominal: Soft. Bowel  sounds are normal.  Musculoskeletal: Normal range of motion.  Neurological: He is alert and oriented to person, place, and time.  Skin: Skin is warm and dry. Rash noted.  Diffuse, papular, erythematous rash on the areas identified in HPI  Psychiatric: He has a normal mood and affect. His behavior is normal.  Nursing note and vitals reviewed.   ED Course  Procedures DIAGNOSTIC STUDIES:      Cerumen disimpaction: I disimpacted cerumen from right ear with an ear curette Oxygen Saturation is 100% on RA, normal by my interpretation.    COORDINATION OF CARE: 9:26 PM - Discussed plans to order shot as well as prednisone pills. Advised to buy 1% hydrocortisone steroid ointment as well as claritin antihistamine. Will treat the right ear. Pt advised of plan for treatment and pt agrees.  Labs Review Labs Reviewed - No data to display  Imaging Review No results found.   EKG Interpretation None      MDM   Final diagnoses:  Poison ivy dermatitis  Cerumen impaction, left    Patient has poison ivy dermatitis. Rx Decadron 8 mg IM. Discharge medication prednisone 50 mg daily for 1 week. Also recommended Claritin daily. I was able to disimpact the cerumen from his right ear. Recommended Debrox for home.  I personally performed the services described in this documentation, which was scribed in my presence. The recorded information has been reviewed and is accurate.   Donnetta Hutching, MD 02/13/15 2259

## 2015-02-13 NOTE — ED Notes (Signed)
Patient reports is breaking out from poison ivy. Complaining of rash and itching to arms and states it is trying to spread to his face. Also complaining of fullness to right ear.

## 2015-02-13 NOTE — Discharge Instructions (Signed)
Take prednisone tablet daily for 7 days. Recommend 1 CLARITIN tablet daily which is an antihistamine.  Also recommend a product called DEBROX to apply in your ear to reduce wax.

## 2015-02-18 ENCOUNTER — Encounter (HOSPITAL_COMMUNITY): Payer: Self-pay | Admitting: Emergency Medicine

## 2015-02-18 ENCOUNTER — Observation Stay (HOSPITAL_COMMUNITY)
Admission: EM | Admit: 2015-02-18 | Discharge: 2015-02-19 | Disposition: A | Discharge status: Home / Self Care | Payer: Medicaid Other | Attending: Internal Medicine | Admitting: Internal Medicine

## 2015-02-18 ENCOUNTER — Emergency Department (HOSPITAL_COMMUNITY): Payer: Medicaid Other

## 2015-02-18 DIAGNOSIS — R0602 Shortness of breath: Secondary | ICD-10-CM | POA: Diagnosis not present

## 2015-02-18 DIAGNOSIS — Z720 Tobacco use: Secondary | ICD-10-CM | POA: Insufficient documentation

## 2015-02-18 DIAGNOSIS — I429 Cardiomyopathy, unspecified: Secondary | ICD-10-CM | POA: Insufficient documentation

## 2015-02-18 DIAGNOSIS — G8929 Other chronic pain: Secondary | ICD-10-CM | POA: Insufficient documentation

## 2015-02-18 DIAGNOSIS — F17210 Nicotine dependence, cigarettes, uncomplicated: Secondary | ICD-10-CM | POA: Diagnosis present

## 2015-02-18 DIAGNOSIS — R51 Headache: Secondary | ICD-10-CM | POA: Diagnosis not present

## 2015-02-18 DIAGNOSIS — Z79899 Other long term (current) drug therapy: Secondary | ICD-10-CM | POA: Diagnosis not present

## 2015-02-18 DIAGNOSIS — I10 Essential (primary) hypertension: Secondary | ICD-10-CM | POA: Diagnosis not present

## 2015-02-18 DIAGNOSIS — I471 Supraventricular tachycardia: Secondary | ICD-10-CM | POA: Diagnosis not present

## 2015-02-18 DIAGNOSIS — R079 Chest pain, unspecified: Secondary | ICD-10-CM | POA: Diagnosis not present

## 2015-02-18 LAB — CBC WITH DIFFERENTIAL/PLATELET
BASOS ABS: 0 10*3/uL (ref 0.0–0.1)
Basophils Relative: 0 % (ref 0–1)
EOS ABS: 0.2 10*3/uL (ref 0.0–0.7)
Eosinophils Relative: 2 % (ref 0–5)
HCT: 40.3 % (ref 39.0–52.0)
Hemoglobin: 14 g/dL (ref 13.0–17.0)
LYMPHS ABS: 3.3 10*3/uL (ref 0.7–4.0)
Lymphocytes Relative: 29 % (ref 12–46)
MCH: 33.8 pg (ref 26.0–34.0)
MCHC: 34.7 g/dL (ref 30.0–36.0)
MCV: 97.3 fL (ref 78.0–100.0)
MONO ABS: 1.3 10*3/uL — AB (ref 0.1–1.0)
Monocytes Relative: 11 % (ref 3–12)
Neutro Abs: 6.7 10*3/uL (ref 1.7–7.7)
Neutrophils Relative %: 58 % (ref 43–77)
Platelets: 267 10*3/uL (ref 150–400)
RBC: 4.14 MIL/uL — ABNORMAL LOW (ref 4.22–5.81)
RDW: 13 % (ref 11.5–15.5)
WBC: 11.5 10*3/uL — ABNORMAL HIGH (ref 4.0–10.5)

## 2015-02-18 LAB — COMPREHENSIVE METABOLIC PANEL
ALT: 28 U/L (ref 17–63)
ANION GAP: 7 (ref 5–15)
AST: 30 U/L (ref 15–41)
Albumin: 3.5 g/dL (ref 3.5–5.0)
Alkaline Phosphatase: 87 U/L (ref 38–126)
BUN: 9 mg/dL (ref 6–20)
CALCIUM: 8.7 mg/dL — AB (ref 8.9–10.3)
CO2: 28 mmol/L (ref 22–32)
CREATININE: 0.83 mg/dL (ref 0.61–1.24)
Chloride: 103 mmol/L (ref 101–111)
GFR calc non Af Amer: >60 mL/min (ref >60)
GLUCOSE: 92 mg/dL (ref 65–99)
Potassium: 3.4 mmol/L — ABNORMAL LOW (ref 3.5–5.1)
SODIUM: 138 mmol/L (ref 135–145)
TOTAL PROTEIN: 7.2 g/dL (ref 6.5–8.1)
Total Bilirubin: 0.2 mg/dL — ABNORMAL LOW (ref 0.3–1.2)

## 2015-02-18 LAB — APTT: APTT: 28 s (ref 24–37)

## 2015-02-18 LAB — TROPONIN I
Troponin I: <0.03 ng/mL (ref <0.031)
Troponin I: 0.03 ng/mL (ref <0.031)
Troponin I: 0.03 ng/mL (ref <0.031)

## 2015-02-18 LAB — PROTIME-INR
INR: 0.92 (ref 0.00–1.49)
Prothrombin Time: 12.6 seconds (ref 11.6–15.2)

## 2015-02-18 MED ORDER — FOLIC ACID 1 MG PO TABS
1.0000 mg | ORAL_TABLET | Freq: Every day | ORAL | Status: DC
  Administered 2015-02-19: 1 mg via ORAL
  Filled 2015-02-18: qty 1

## 2015-02-18 MED ORDER — RAMIPRIL 5 MG PO CAPS
5.0000 mg | ORAL_CAPSULE | Freq: Every day | ORAL | Status: DC
  Administered 2015-02-19: 5 mg via ORAL
  Filled 2015-02-18 (×3): qty 1

## 2015-02-18 MED ORDER — ASPIRIN 81 MG PO CHEW
324.0000 mg | CHEWABLE_TABLET | Freq: Once | ORAL | Status: AC
  Administered 2015-02-18: 324 mg via ORAL
  Filled 2015-02-18: qty 4

## 2015-02-18 MED ORDER — NITROGLYCERIN 0.4 MG SL SUBL
0.4000 mg | SUBLINGUAL_TABLET | SUBLINGUAL | Status: AC | PRN
  Administered 2015-02-18 (×3): 0.4 mg via SUBLINGUAL
  Filled 2015-02-18: qty 1

## 2015-02-18 MED ORDER — ONDANSETRON HCL 4 MG/2ML IJ SOLN: 4.0000 mg | Freq: Four times a day (QID) | INTRAMUSCULAR | Status: DC | PRN

## 2015-02-18 MED ORDER — AMLODIPINE BESYLATE 5 MG PO TABS
5.0000 mg | ORAL_TABLET | Freq: Every day | ORAL | Status: DC
  Administered 2015-02-19: 5 mg via ORAL
  Filled 2015-02-18: qty 1

## 2015-02-18 MED ORDER — HYDROCODONE-ACETAMINOPHEN 5-325 MG PO TABS
1.0000 | ORAL_TABLET | ORAL | Status: DC | PRN
  Administered 2015-02-18 – 2015-02-19 (×2): 1 via ORAL
  Filled 2015-02-18 (×2): qty 1

## 2015-02-18 MED ORDER — ACETAMINOPHEN 325 MG PO TABS: 650.0000 mg | ORAL_TABLET | ORAL | Status: DC | PRN

## 2015-02-18 MED ORDER — MORPHINE SULFATE 2 MG/ML IJ SOLN
2.0000 mg | INTRAMUSCULAR | Status: DC | PRN
  Filled 2015-02-18: qty 1

## 2015-02-18 MED ORDER — ONDANSETRON HCL 4 MG/2ML IJ SOLN: 4.0000 mg | Freq: Three times a day (TID) | INTRAMUSCULAR | Status: DC | PRN

## 2015-02-18 MED ORDER — ENOXAPARIN SODIUM 40 MG/0.4ML ~~LOC~~ SOLN: 40.0000 mg | SUBCUTANEOUS | Status: DC

## 2015-02-18 MED ORDER — VITAMIN B-1 100 MG PO TABS
100.0000 mg | ORAL_TABLET | Freq: Every day | ORAL | Status: DC
Start: 2015-02-18 — End: 2015-02-19
  Administered 2015-02-19: 100 mg via ORAL
  Filled 2015-02-18: qty 1

## 2015-02-18 NOTE — ED Notes (Signed)
Having chest pain since 9:30 this am.  No radiating.  C/o headache and SOB.  Rates pain 9/10. Did not take ASA.

## 2015-02-18 NOTE — ED Provider Notes (Signed)
CSN: [id number]     Arrival date & time 02/18/15  1553 History   First MD Initiated Contact with Patient 02/18/15 1604     Chief Complaint  Patient presents with  . Chest Pain  . Shortness of Breath  . 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 He also has a history of hypertension and paroxysmal SVT. He has been seen by local cardiology due to PSVT and bradycardia.  Abnormal exercise Myoview that was performed several years ago, he does not have frequent chest pain, he does not have exertional symptoms. He reports that approximately 7 hours ago he developed acute onset of a heaviness in his chest which is described in the location of the mid and right side of the chest, nothing makes this better or worse, it is not positional, not associated with swelling of the legs, not worsened with deep breathing, not associated with fevers or coughing. He does have a history of hypertension, hypercholesterolemia according to the patient and is a smoker. Currently his symptoms are mild, pressure-like and right-sided.  IMPRESSION: Abnormal exercise Myoview. There were no diagnostic ST-segment abnormalities. The patient did not have clear evidence of chronotropic incompetence with adequate heart rate response. He did have bursts of what appears to be PSVT and occasional to frequent ventricular ectopy. Perfusion imaging shows evidence of scar versus attenuation affecting a small portion of the anteroseptum at the apex at a moderate sized inferior defect. No large ischemic zones are noted and the ejection fraction was 37% with global hypokinesis.  Past Medical History  Diagnosis Date  . Essential hypertension, benign   . 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 PSVT (paroxysmal supraventricular tachycardia)     Noted  during exercise testing  . Bradycardia     No clear evidence of chronotropic incompetence  . History of seizures   . Headache(784.0)   . History of alcohol abuse   . Chronic pain    Past Surgical History  Procedure Laterality Date  . Right hand surgery  2011    Abscess drainage  . Colonoscopy  05/19/2012    Procedure: COLONOSCOPY;  Surgeon: Corbin Ade, MD;  Location: [facility];  Service: Endoscopy;  Laterality: N/A;  9:30 AM   Family History  Problem Relation Age of Onset  . Hypertension Mother   . Hyperlipidemia Mother    History  Substance Use Topics  . Smoking status: Current Every Day Smoker -- 1.00 packs/day for 22 years    Types: Cigarettes    Start date: 07/28/1972  . Smokeless tobacco: Not on file     Comment: started back 05-2013  . Alcohol Use: 0.0 oz/week    0 Standard drinks or equivalent per week     Comment: quit 2 months ago then started again    Review of Systems  All other systems reviewed and are negative.     Allergies  Review of patient's allergies indicates no known allergies.  Home Medications   Prior to Admission medications   Medication Sig Start Date End Date Taking? Authorizing Provider  acetaminophen-codeine (TYLENOL #3) 300-30 MG per tablet Take 1-2 tablets by mouth every 6 (six) hours as needed for moderate pain. Please take this medication with food Patient not taking: Reported on 02/13/2015 12/15/14   Ivery Quale, PA-C  amLODipine (NORVASC) 5 MG tablet Take 1 tablet (5  mg total) by mouth daily. 11/01/14   Jonelle Sidle, MD  folic acid (FOLVITE) 1 MG tablet Take 1 mg by mouth daily.    Historical Provider, MD  HYDROcodone-acetaminophen (NORCO/VICODIN) 5-325 MG per tablet Take 1 tablet by mouth every 4 (four) hours as needed. Patient taking differently: Take 1 tablet by mouth every 4 (four) hours as needed for moderate pain.  07/14/14   [name], PA-C  predniSONE (DELTASONE) 50 MG tablet 1 tablet daily for 7 days 02/13/15    Donnetta Hutching, MD  ramipril (ALTACE) 5 MG capsule Take 1 capsule (5 mg total) by mouth daily. 11/01/14   Jonelle Sidle, MD  thiamine (VITAMIN B-1) 100 MG tablet Take 100 mg by mouth daily.    Historical Provider, MD   BP 110/68 mmHg  Pulse 60  Temp(Src) 98.8 F (37.1 C) (Oral)  Resp 20  Ht  (1.626 m)  Wt 110 lb (49.896 kg)  BMI 18.87 kg/m2  SpO2 97% Physical Exam  Constitutional: He appears well-developed and well-nourished. No distress.  HENT:  Head: Normocephalic and atraumatic.  Mouth/Throat: Oropharynx is clear and moist. No oropharyngeal exudate.  Eyes: Conjunctivae and EOM are normal. Pupils are equal, round, and reactive to light. Right eye exhibits no discharge. Left eye exhibits no discharge. No scleral icterus.  Neck: Normal range of motion. Neck supple. No JVD present. No thyromegaly present.  Cardiovascular: Normal rate, regular rhythm, normal heart sounds and intact distal pulses.  Exam reveals no gallop and no friction rub.   No murmur heard. Pulmonary/Chest: Effort normal and breath sounds normal. No respiratory distress. He has no wheezes. He has no rales.  Abdominal: Soft. Bowel sounds are normal. He exhibits no distension and no mass. There is no tenderness.  Musculoskeletal: Normal range of motion. He exhibits no edema or tenderness.  Lymphadenopathy:    He has no cervical adenopathy.  Neurological: He is alert. Coordination normal.  Skin: Skin is warm and dry. No rash noted. No erythema.  Psychiatric: He has a normal mood and affect. His behavior is normal.  Nursing note and vitals reviewed.   ED Course  Procedures (including critical care time) Labs Review Labs Reviewed  CBC WITH DIFFERENTIAL/PLATELET - Abnormal; Notable for the following:    WBC 11.5 (*)    RBC 4.14 (*)    Monocytes Absolute 1.3 (*)    All other components within normal limits  COMPREHENSIVE METABOLIC PANEL - Abnormal; Notable for the following:    Potassium 3.4 (*)    Calcium 8.7  (*)    Total Bilirubin 0.2 (*)    All other components within normal limits  TROPONIN I  APTT  PROTIME-INR    Imaging Review Dg Chest Portable 1 View  02/18/2015   CLINICAL DATA:  Shortness of breath, chest pain, headache  EXAM: PORTABLE CHEST - 1 VIEW  COMPARISON:  07/22/2014  FINDINGS: The heart size and mediastinal contours are within normal limits. Both lungs are clear. The visualized skeletal structures are unremarkable.  IMPRESSION: No active disease.   Electronically Signed   By: Elige Ko   On: 02/18/2015 16:51     EKG Interpretation   Date/Time:  Sunday February 18 2015 16:02:34 EDT Ventricular Rate:  58 PR Interval:  142 QRS Duration: 89 QT Interval:  397 QTC Calculation: 390 R Axis:   86 Text Interpretation:  Sinus or ectopic atrial rhythm Anteroseptal infarct,  old Since last tracing rate faster Left ventricular hypertrophy Confirmed  by [facility]  MD, Arlys John ([id number]) on 02/18/2015 5:10:25 PM      MDM   Final diagnoses:  Chest pain, unspecified chest pain type    The patient does have occasional ectopy, overall his exam is fairly unremarkable. The EKG shows nonspecific T wave abnormalities but no signs of acute ST elevation to consider this to be acute ischemia. The patient will need further workup with labs, medications and a chest x-ray. Vital signs are unremarkable, nitroglycerin and aspirin ordered.  I have personally viewed and interpreted the imaging and agree with radiologist interpretation.  The patient's symptoms improved significantly with nitroglycerin and aspirin. Labs are unremarkable, heart score is 4, likely would benefit from admission for rule out.  Meds given in ED:  Medications  aspirin chewable tablet 324 mg (324 mg Oral Given 02/18/15 1634)  nitroGLYCERIN (NITROSTAT) SL tablet 0.4 mg (0.4 mg Sublingual Given 02/18/15 1649)    New Prescriptions   No medications on file        Eber Hong, MD 02/18/15 1729

## 2015-02-18 NOTE — H&P (Signed)
[facility] History and Physical  Christopher Austin ZOX:[id number] DOB: 1956-03-14 DOA: 02/18/2015  Referring physician: ER, Dr. Hyacinth Meeker PCP: Avon Gully, MD   Chief Complaint: Right-sided chest pain  HPI: Christopher Austin is a 59 y.o. male  This is a 59 year old man who presents with onset of right-sided chest pain at 9:00 this morning. He describes it as a dull/heaviness. 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 He has a history of hypertension and paroxysmal SVT. The pain has lasted for 5 hours or so until he presented to the emergency room when it was apparently relieved by nitroglycerin lingual. He denies any cough, fever, coughing, nausea or vomiting. He denies any leg swelling. Currently his symptoms are mild and much improved since when he presented. He is now being admitted for further investigation.   Review of Systems:  Apart from symptoms above, all systems negative..   Past Medical History  Diagnosis Date  . Essential hypertension, benign   . 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 PSVT (paroxysmal supraventricular tachycardia)     Noted during exercise testing  . Bradycardia     No clear evidence of chronotropic incompetence  . History of seizures   . Headache(784.0)   . History of alcohol abuse   . Chronic pain    Past Surgical History  Procedure Laterality Date  . Right hand surgery  2011    Abscess drainage  . Colonoscopy  05/19/2012    Procedure: COLONOSCOPY;  Surgeon: Corbin Ade, MD;  Location: [facility];  Service: Endoscopy;  Laterality: N/A;  9:30 AM   Social History:  reports that he has been smoking Cigarettes.  He started smoking about 42 years ago. He has a 22 pack-year smoking history. He does not have any smokeless tobacco history on file. He reports that he drinks alcohol. He reports that he does not use illicit drugs.  No Known Allergies  Family History  Problem Relation Age of Onset  .  Hypertension Mother   . Hyperlipidemia Mother     Prior to Admission medications   Medication Sig Start Date End Date Taking? Authorizing Provider  acetaminophen-codeine (TYLENOL #3) 300-30 MG per tablet Take 1-2 tablets by mouth every 6 (six) hours as needed for moderate pain. Please take this medication with food Patient not taking: Reported on 02/13/2015 12/15/14   Ivery Quale, PA-C  amLODipine (NORVASC) 5 MG tablet Take 1 tablet (5 mg total) by mouth daily. 11/01/14   Jonelle Sidle, MD  folic acid (FOLVITE) 1 MG tablet Take 1 mg by mouth daily.    Historical Provider, MD  HYDROcodone-acetaminophen (NORCO/VICODIN) 5-325 MG per tablet Take 1 tablet by mouth every 4 (four) hours as needed. Patient taking differently: Take 1 tablet by mouth every 4 (four) hours as needed for moderate pain.  07/14/14   [name], PA-C  predniSONE (DELTASONE) 50 MG tablet 1 tablet daily for 7 days 02/13/15   Donnetta Hutching, MD  ramipril (ALTACE) 5 MG capsule Take 1 capsule (5 mg total) by mouth daily. 11/01/14   Jonelle Sidle, MD  thiamine (VITAMIN B-1) 100 MG tablet Take 100 mg by mouth daily.    Historical Provider, MD   Physical Exam: Filed Vitals:   02/18/15 1635 02/18/15 1641 02/18/15 1647 02/18/15 1658  BP: 131/93 115/69 113/67 110/68  Pulse: 70 60 58 60  Temp:      TempSrc:      Resp: 18 20 20  Height:      Weight:      SpO2: 97% 94% 97%     Wt Readings from Last 3 Encounters:  02/18/15 49.896 kg (110 lb)  02/13/15 46.72 kg (103 lb)  12/15/14 46.72 kg (103 lb)    General:  Appears calm and comfortable. He does not appear to be in pain at the present time. Eyes: PERRL, normal lids, irises & conjunctiva ENT: grossly normal hearing, lips & tongue Neck: no LAD, masses or thyromegaly Cardiovascular: RRR, no m/r/g. No LE edema. No pericardial rubs. Telemetry: SR, no arrhythmias  Respiratory: CTA bilaterally, no w/r/r. Normal respiratory effort. No pleural rub. Abdomen: soft, ntnd Skin: no  rash or induration seen on limited exam Musculoskeletal: grossly normal tone BUE/BLE Psychiatric: grossly normal mood and affect, speech fluent and appropriate Neurologic: grossly non-focal.          Labs on Admission:  Basic Metabolic Panel:  Recent Labs Lab 02/18/15 1605  NA 138  K 3.4*  CL 103  CO2 28  GLUCOSE 92  BUN 9  CREATININE 0.83  CALCIUM 8.7*   Liver Function Tests:  Recent Labs Lab 02/18/15 1605  AST 30  ALT 28  ALKPHOS 87  BILITOT 0.2*  PROT 7.2  ALBUMIN 3.5   No results for input(s): LIPASE, AMYLASE in the last 168 hours. No results for input(s): AMMONIA in the last 168 hours. CBC:  Recent Labs Lab 02/18/15 1605  WBC 11.5*  NEUTROABS 6.7  HGB 14.0  HCT 40.3  MCV 97.3  PLT 267   Cardiac Enzymes:  Recent Labs Lab 02/18/15 1605  TROPONINI <0.03    BNP (last 3 results) No results for input(s): BNP in the last 8760 hours.  ProBNP (last 3 results) No results for input(s): PROBNP in the last 8760 hours.  CBG: No results for input(s): GLUCAP in the last 168 hours.  Radiological Exams on Admission: Dg Chest Portable 1 View  02/18/2015   CLINICAL DATA:  Shortness of breath, chest pain, headache  EXAM: PORTABLE CHEST - 1 VIEW  COMPARISON:  07/22/2014  FINDINGS: The heart size and mediastinal contours are within normal limits. Both lungs are clear. The visualized skeletal structures are unremarkable.  IMPRESSION: No active disease.   Electronically Signed   By: Elige Ko   On: 02/18/2015 16:51    EKG: Independently reviewed. Normal sinus rhythm without any acute ST-T wave changes.  Assessment/Plan   1. Right-sided chest pain. The etiology is not clear but he does have risk factors. We will cycle cardiac enzymes. I will request cardiology consultation. He may require stress test. 2. Hypertension. Stable. 3. Nonischemic cardiomyopathy. This appears to be stable.  Further recommendations will depend on patient's hospital  progress.  Code Status: Full code.   DVT Prophylaxis: Lovenox.  Family Communication: I discussed the plan with the patient at the bedside.   Disposition Plan: Home when medically stable.   Time spent: 45 minutes.  Wilson Singer [facility] Pager [phone].

## 2015-02-19 LAB — TROPONIN I

## 2015-02-19 NOTE — Discharge Summary (Signed)
Physician Discharge Summary  Patient ID: FARRON YEARGIN MRN: [id number] DOB/AGE: 1956/03/13 59 y.o. Primary Care Physician:Nakia Koble, MD Admit date: 02/18/2015 Discharge date: 02/19/2015    Discharge Diagnoses:   Active Problems:   Essential hypertension, benign   Cardiomyopathy   PSVT (paroxysmal supraventricular tachycardia)   Tobacco abuse   Chest pain     Medication List    TAKE these medications        amLODipine 5 MG tablet  Commonly known as:  NORVASC  Take 1 tablet (5 mg total) by mouth daily.     folic acid 1 MG tablet  Commonly known as:  FOLVITE  Take 1 mg by mouth daily.     HYDROcodone-acetaminophen 5-325 MG per tablet  Commonly known as:  NORCO/VICODIN  Take 1 tablet by mouth every 4 (four) hours as needed.     ramipril 5 MG capsule  Commonly known as:  ALTACE  Take 1 capsule (5 mg total) by mouth daily.     thiamine 100 MG tablet  Commonly known as:  VITAMIN B-1  Take 100 mg by mouth daily.        Discharged Condition: improved    Consults: none  Significant Diagnostic Studies: Dg Chest Portable 1 View  02/18/2015   CLINICAL DATA:  Shortness of breath, chest pain, headache  EXAM: PORTABLE CHEST - 1 VIEW  COMPARISON:  07/22/2014  FINDINGS: The heart size and mediastinal contours are within normal limits. Both lungs are clear. The visualized skeletal structures are unremarkable.  IMPRESSION: No active disease.   Electronically Signed   By: Elige Ko   On: 02/18/2015 16:51    Lab Results: Basic Metabolic Panel:  Recent Labs  10/15/20 1605  NA 138  K 3.4*  CL 103  CO2 28  GLUCOSE 92  BUN 9  CREATININE 0.83  CALCIUM 8.7*   Liver Function Tests:  Recent Labs  02/18/15 1605  AST 30  ALT 28  ALKPHOS 87  BILITOT 0.2*  PROT 7.2  ALBUMIN 3.5     CBC:  Recent Labs  02/18/15 1605  WBC 11.5*  NEUTROABS 6.7  HGB 14.0  HCT 40.3  MCV 97.3  PLT 267    No results found for this or any previous visit (from the  past 240 hour(s)).   Hospital Course:   This is a 59 years old male with history of multiple medical illnesses was admitted due to RT sided chest pain. The pain gets worse hen he moves on the RT side. Serial EKG and cardiac enzymes were negative. His symptom improve and patient is discharge home in stable condition to be followed in out patient.  Discharge Exam: Blood pressure 134/78, pulse 69, temperature 99.7 F (37.6 C), temperature source Oral, resp. rate 18, height 5\' 4"  (1.626 m), weight 49.896 kg (110 lb), SpO2 97 %.    Disposition:  Home.      Signed: Namari Breton   02/19/2015, 8:09 AM

## 2015-02-22 NOTE — Progress Notes (Signed)
Patient discharged home with instructions given on medications,and follow up visits,patient verbalized understanding. No c/o pain or discomfort noted.. Accompanied by staff to an awaiting vehicle.. 

## 2015-04-30 ENCOUNTER — Ambulatory Visit (INDEPENDENT_AMBULATORY_CARE_PROVIDER_SITE_OTHER): Payer: Medicaid Other | Admitting: Cardiology

## 2015-04-30 ENCOUNTER — Encounter: Payer: Self-pay | Admitting: Cardiology

## 2015-04-30 VITALS — BP 130/62 | HR 62 | Ht 64.0 in | Wt 104.6 lb

## 2015-04-30 DIAGNOSIS — I10 Essential (primary) hypertension: Secondary | ICD-10-CM | POA: Diagnosis not present

## 2015-04-30 DIAGNOSIS — I429 Cardiomyopathy, unspecified: Secondary | ICD-10-CM

## 2015-04-30 DIAGNOSIS — I471 Supraventricular tachycardia: Secondary | ICD-10-CM | POA: Diagnosis not present

## 2015-04-30 NOTE — Patient Instructions (Signed)
Your physician wants you to follow-up in: 6 months with Dr Diona Browner You will receive a reminder letter in the mail two months in advance. If you don't receive a letter, please call our office to schedule the follow-up appointment.    Your physician recommends that you continue on your current medications as directed. Please refer to the Current Medication list given to you today.    Your physician has requested that you have an echocardiogram. Echocardiography is a painless test that uses sound waves to create images of your heart. It provides your doctor with information about the size and shape of your heart and how well your heart's chambers and valves are working. This procedure takes approximately one hour. There are no restrictions for this procedure.      Thank you for choosing [facility] !

## 2015-04-30 NOTE — Progress Notes (Signed)
Cardiology Office Note  Date: 04/30/2015   ID: Christopher Austin, DOB 01/17/1956, MRN [id number]  PCP: Avon Gully, MD  Primary Cardiologist: Nona Dell, MD   Chief Complaint  Patient presents with  . Cardiomyopathy  . PSVT    History of Present Illness: Christopher Austin is a 59 y.o. male last seen in April. Interval record review finds hospitalization in July with right-sided chest discomfort. Troponin I levels were normal at that time and ECG as outlined below showed no acute findings. He presents today for a routine follow-up visit. States that he has not had any chest pain or palpitations. He reports compliance with his medications which are reviewed below.  Last echocardiogram from 2014 showed improvement in LVEF. We have not had him on a beta blocker therapy with low resting heart rate and ectopic atrial bradycardia. Fortunately, he has not had major breakthrough PSVT.  He also has a history of alcohol abuse, however states that he has cut back on this significantly.  Recent lab work from July is also reviewed.   Past Medical History  Diagnosis Date  . Essential hypertension, benign   . 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 PSVT (paroxysmal supraventricular tachycardia) (HCC)     Noted during exercise testing  . Bradycardia     No clear evidence of chronotropic incompetence  . History of seizures   . Headache(784.0)   . History of alcohol abuse   . Chronic pain     Past Surgical History  Procedure Laterality Date  . Right hand surgery  2011    Abscess drainage  . Colonoscopy  05/19/2012    Procedure: COLONOSCOPY;  Surgeon: Corbin Ade, MD;  [facility];  Service: Endoscopy;  Laterality: N/A;  9:30 AM    Current Outpatient Prescriptions  Medication Sig Dispense Refill  . amLODipine (NORVASC) 5 MG tablet Take 1 tablet (5 mg total) by mouth daily. 90 tablet 3  . folic acid (FOLVITE) 1 MG tablet Take 1 mg by mouth daily.    .  ramipril (ALTACE) 5 MG capsule Take 1 capsule (5 mg total) by mouth daily. 90 capsule 3  . thiamine (VITAMIN B-1) 100 MG tablet Take 100 mg by mouth daily.     No current facility-administered medications for this visit.    Allergies:  Review of patient's allergies indicates no known allergies.   Social History: The patient  reports that he has been smoking Cigarettes.  He started smoking about 42 years ago. He has a 22 pack-year smoking history. He does not have any smokeless tobacco history on file. He reports that he drinks alcohol. He reports that he does not use illicit drugs.   ROS:  Please see the history of present illness. Otherwise, complete review of systems is positive for none.  All other systems are reviewed and negative.   Physical Exam: VS:  BP 130/62 mmHg  Pulse 62  Ht  (1.626 m)  Wt 104 lb 9.6 oz (47.446 kg)  BMI 17.95 kg/m2  SpO2 98%, BMI Body mass index is 17.95 kg/(m^2).  Wt Readings from Last 3 Encounters:  04/30/15 104 lb 9.6 oz (47.446 kg)  02/18/15 110 lb (49.896 kg)  02/13/15 103 lb (46.72 kg)     Comfortable at rest.  HEENT: Conjunctiva and lids normal, oropharynx clear.  Neck: Supple, no elevated JVP or carotid bruits, no thyromegaly.  Lungs: Clear to auscultation, nonlabored breathing at rest.  Cardiac: Regular rate  and rhythm, no S3 or significant systolic murmur, no pericardial rub.  Abdomen: Soft, nontender, bowel sounds present.  Extremities: No pitting edema, distal pulses 2+.  Skin: Warm and dry. Musculoskeletal: No kyphosis. Neuropsychiatric: Alert and oriented x3, affect appropriate.   ECG: Tracing from 02/19/2015 showed probable ectopic atrial rhythm with increased voltage in the precordial leads, nonspecific ST changes.  Recent Labwork: 02/18/2015: ALT 28; AST 30; BUN 9; Creatinine, Ser 0.83; Hemoglobin 14.0; Platelets 267; Potassium 3.4*; Sodium 138   Other Studies Reviewed Today:  Chest x-ray 02/18/2015: FINDINGS: The  heart size and mediastinal contours are within normal limits. Both lungs are clear. The visualized skeletal structures are unremarkable.  IMPRESSION: No active disease.  Echocardiogram 12/17/2012: Study Conclusions  - Left ventricle: The cavity size was normal. Wall thickness was normal. Systolic function was normal. The estimated ejection fraction was 55%. Wall motion was normal; there were no regional wall motion abnormalities. - Atrial septum: No defect or patent foramen ovale was identified. Impressions:  - Compared to the prior study performed 05/19/12, LV systolic function improved. Estimated EF has increased from 35-40% to 55%. LV size has also normalized.  ASSESSMENT AND PLAN:  1. History of nonischemic cardiomyopathy with normalization of LVEF as of 2014. He remains symptomatically stable, on Norvasc and Altace. Not on beta blocker with history of ectopic atrial bradycardia. Echocardiogram will be obtained to reassess LVEF.  2. PSVT, currently quiescent. Recent ECGs reviewed.  3. History of alcohol abuse. We continue to address benefits of complete alcohol cessation.  4. Essential hypertension, blood pressure control is adequate today.  Current medicines were reviewed at length with the patient today.   Orders Placed This Encounter  Procedures  . Echocardiogram    Disposition: FU with me in 6 months.   Signed, Jonelle Sidle, MD, [facility] 04/30/2015 9:04 AM    [name] at [facility] [address] [place], [place] [address] Phone: [phone]; Fax: [phone]

## 2015-05-02 ENCOUNTER — Ambulatory Visit (HOSPITAL_COMMUNITY)
Admission: RE | Admit: 2015-05-02 | Discharge: 2015-05-02 | Disposition: A | Discharge status: Home / Self Care | Payer: Medicaid Other | Source: Ambulatory Visit | Attending: Cardiology | Admitting: Cardiology

## 2015-05-02 DIAGNOSIS — Z720 Tobacco use: Secondary | ICD-10-CM | POA: Insufficient documentation

## 2015-05-02 DIAGNOSIS — I10 Essential (primary) hypertension: Secondary | ICD-10-CM | POA: Insufficient documentation

## 2015-05-02 DIAGNOSIS — R079 Chest pain, unspecified: Secondary | ICD-10-CM | POA: Insufficient documentation

## 2015-05-02 DIAGNOSIS — I429 Cardiomyopathy, unspecified: Secondary | ICD-10-CM

## 2015-09-15 ENCOUNTER — Encounter (HOSPITAL_COMMUNITY): Payer: Self-pay | Admitting: Emergency Medicine

## 2015-09-15 ENCOUNTER — Emergency Department (HOSPITAL_COMMUNITY)
Admission: EM | Admit: 2015-09-15 | Discharge: 2015-09-15 | Disposition: A | Discharge status: Home / Self Care | Payer: Medicaid Other | Attending: Emergency Medicine | Admitting: Emergency Medicine

## 2015-09-15 ENCOUNTER — Emergency Department (HOSPITAL_COMMUNITY): Payer: Medicaid Other

## 2015-09-15 DIAGNOSIS — G8929 Other chronic pain: Secondary | ICD-10-CM | POA: Insufficient documentation

## 2015-09-15 DIAGNOSIS — Z79899 Other long term (current) drug therapy: Secondary | ICD-10-CM | POA: Diagnosis not present

## 2015-09-15 DIAGNOSIS — J209 Acute bronchitis, unspecified: Secondary | ICD-10-CM | POA: Diagnosis not present

## 2015-09-15 DIAGNOSIS — I10 Essential (primary) hypertension: Secondary | ICD-10-CM | POA: Insufficient documentation

## 2015-09-15 DIAGNOSIS — J40 Bronchitis, not specified as acute or chronic: Secondary | ICD-10-CM

## 2015-09-15 DIAGNOSIS — F17210 Nicotine dependence, cigarettes, uncomplicated: Secondary | ICD-10-CM | POA: Diagnosis not present

## 2015-09-15 DIAGNOSIS — R05 Cough: Secondary | ICD-10-CM | POA: Diagnosis present

## 2015-09-15 MED ORDER — AZITHROMYCIN 250 MG PO TABS: 250.0000 mg | ORAL_TABLET | Freq: Every day | ORAL | Status: DC

## 2015-09-15 MED ORDER — ACETAMINOPHEN 325 MG PO TABS
ORAL_TABLET | ORAL | Status: AC
  Filled 2015-09-15: qty 2

## 2015-09-15 MED ORDER — ACETAMINOPHEN 325 MG PO TABS
650.0000 mg | ORAL_TABLET | Freq: Once | ORAL | Status: AC
  Administered 2015-09-15: 13:00:00 via ORAL

## 2015-09-15 MED ORDER — BENZONATATE 100 MG PO CAPS: 100.0000 mg | ORAL_CAPSULE | Freq: Three times a day (TID) | ORAL | Status: DC

## 2015-09-15 NOTE — ED Provider Notes (Signed)
CSN: [id number]     Arrival date & time 09/15/15  1137 History   First MD Initiated Contact with Patient 09/15/15 1214     Chief Complaint  Patient presents with  . Cough     (Consider location/radiation/quality/duration/timing/severity/associated sxs/prior Treatment) Patient is a 60 y.o. male presenting with cough. The history is provided by the patient. No language interpreter was used.  Cough Cough characteristics:  Productive Severity:  Moderate Onset quality:  Gradual Duration:  4 days Progression:  Worsening Chronicity:  New Smoker: yes   Context: upper respiratory infection   Relieved by:  Nothing Worsened by:  Nothing tried Ineffective treatments:  None tried Associated symptoms: chest pain, fever and shortness of breath     Past Medical History  Diagnosis Date  . Essential hypertension, benign   . 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 PSVT (paroxysmal supraventricular tachycardia) (HCC)     Noted during exercise testing  . Bradycardia     No clear evidence of chronotropic incompetence  . History of seizures   . Headache(784.0)   . History of alcohol abuse   . Chronic pain    Past Surgical History  Procedure Laterality Date  . Right hand surgery  2011    Abscess drainage  . Colonoscopy  05/19/2012    Procedure: COLONOSCOPY;  Surgeon: Corbin Ade, MD;  [facility];  Service: Endoscopy;  Laterality: N/A;  9:30 AM   Family History  Problem Relation Age of Onset  . Hypertension Mother   . Hyperlipidemia Mother    Social History  Substance Use Topics  . Smoking status: Current Every Day Smoker -- 1.00 packs/day for 22 years    Types: Cigarettes    Start date: 07/28/1972  . Smokeless tobacco: Never Used  . Alcohol Use: 6.0 oz/week    0 Standard drinks or equivalent, 10 Cans of beer per week    Review of Systems  Constitutional: Positive for fever.  Respiratory: Positive for cough and shortness of breath.   Cardiovascular:  Positive for chest pain.  All other systems reviewed and are negative.     Allergies  Review of patient's allergies indicates no known allergies.  Home Medications   Prior to Admission medications   Medication Sig Start Date End Date Taking? Authorizing Provider  amLODipine (NORVASC) 5 MG tablet Take 1 tablet (5 mg total) by mouth daily. 11/01/14  Yes Jonelle Sidle, MD  folic acid (FOLVITE) 1 MG tablet Take 1 mg by mouth daily.   Yes Historical Provider, MD  ramipril (ALTACE) 5 MG capsule Take 1 capsule (5 mg total) by mouth daily. 11/01/14  Yes Jonelle Sidle, MD   BP 132/69 mmHg  Pulse 86  Temp(Src) 102 F (38.9 C) (Oral)  Resp 18  Ht  (1.626 m)  Wt 47.628 kg  BMI 18.01 kg/m2  SpO2 95% Physical Exam  Constitutional: He is oriented to person, place, and time. He appears well-developed and well-nourished.  HENT:  Head: Normocephalic.  Eyes: Conjunctivae and EOM are normal. Pupils are equal, round, and reactive to light.  Neck: Normal range of motion.  Cardiovascular: Normal rate.   Pulmonary/Chest: Effort normal.  rhonchi  Abdominal: Soft. He exhibits no distension.  Musculoskeletal: Normal range of motion.  Neurological: He is alert and oriented to person, place, and time.  Skin: Skin is warm.  Psychiatric: He has a normal mood and affect.  Nursing note and vitals reviewed.   ED Course  Procedures (including critical care time) Labs Review Labs Reviewed - No data to display  Imaging Review Dg Chest 2 View  09/15/2015  CLINICAL DATA:  Fever and cough for 3-4 days. EXAM: CHEST  2 VIEW COMPARISON:  02/18/2015 FINDINGS: Cardiac silhouette is normal in size and configuration. Normal mediastinal and hilar contours. Lungs are mildly hyperexpanded but clear. No pleural effusion or pneumothorax. Bony thorax is intact. IMPRESSION: No active cardiopulmonary disease. Electronically Signed   By: Amie Portland M.D.   On: 09/15/2015 13:26   I have personally reviewed and  evaluated these images and lab results as part of my medical decision-making.   EKG Interpretation None      MDM Chest xray no pneumonia.  Pt advised tylenol for fever, I will treat for bronchitis.  Pt advised to see his Md for recheck.    Final diagnoses:  Bronchitis    Meds ordered this encounter  Medications  . acetaminophen (TYLENOL) tablet 650 mg    Sig:   . acetaminophen (TYLENOL) 325 MG tablet    Sig:     Pruitt, Ginger   : cabinet override  . azithromycin (ZITHROMAX) 250 MG tablet    Sig: Take 1 tablet (250 mg total) by mouth daily. Take first 2 tablets together, then 1 every day until finished.    Dispense:  6 tablet    Refill:  0    Order Specific Question:  Supervising Provider    Answer:  MILLER, BRIAN [[id number]]  . benzonatate (TESSALON) 100 MG capsule    Sig: Take 1 capsule (100 mg total) by mouth every 8 (eight) hours.    Dispense:  21 capsule    Refill:  0    Order Specific Question:  Supervising Provider    Answer:  Eber Hong [[id number]]      Lonia Skinner [place], PA-C 09/15/15 1536  Eber Hong, MD 09/15/15 1538

## 2015-09-15 NOTE — ED Notes (Signed)
Patient c/o fever with productive cough, body aches, nausea, and diarrhea. Per patient sputum thick yellow. Unsure of exact temp. Denies any vomiting. Per patient has not taken anything today for fever.

## 2015-10-29 ENCOUNTER — Encounter: Payer: Self-pay | Admitting: Cardiology

## 2015-10-29 ENCOUNTER — Ambulatory Visit (INDEPENDENT_AMBULATORY_CARE_PROVIDER_SITE_OTHER): Payer: Medicaid Other | Admitting: Cardiology

## 2015-10-29 VITALS — BP 104/60 | HR 55 | Ht 64.0 in | Wt 106.0 lb

## 2015-10-29 DIAGNOSIS — I471 Supraventricular tachycardia: Secondary | ICD-10-CM

## 2015-10-29 DIAGNOSIS — I10 Essential (primary) hypertension: Secondary | ICD-10-CM

## 2015-10-29 DIAGNOSIS — I429 Cardiomyopathy, unspecified: Secondary | ICD-10-CM

## 2015-10-29 NOTE — Patient Instructions (Signed)
Your physician wants you to follow-up in: 6 months with Dr McDowell You will receive a reminder letter in the mail two months in advance. If you don't receive a letter, please call our office to schedule the follow-up appointment.     Your physician recommends that you continue on your current medications as directed. Please refer to the Current Medication list given to you today.    If you need a refill on your cardiac medications before your next appointment, please call your pharmacy.     Thank you for choosing [facility] !        

## 2015-10-29 NOTE — Progress Notes (Signed)
Cardiology Office Note  Date: 10/29/2015   ID: LESLIE DESCHENES, DOB [birth year], MRN [id number]  PCP: Avon Gully, MD  Primary Cardiologist: Nona Dell, MD   Chief Complaint  Patient presents with  . History of cardiomyopathy  . PSVT    History of Present Illness: Christopher Austin is a 60 y.o. male last seen in October 2016. He continues to do well from a cardiac perspective, no reported palpitations, and stable NYHA class 1-2 dyspnea.  He tells me that he has cut back on alcohol significantly, is also trying to quit smoking.  We reviewed his medications. He continues on Altace and Norvasc. We have not put him on beta blocker with low resting heart rate. Follow-up echocardiogram from October 2016 showed LVEF 50-55%.  Past Medical History  Diagnosis Date  . Essential hypertension, benign   . 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 PSVT (paroxysmal supraventricular tachycardia) (HCC)     Noted during exercise testing  . Bradycardia     No clear evidence of chronotropic incompetence  . History of seizures   . Headache(784.0)   . History of alcohol abuse   . Chronic pain     Past Surgical History  Procedure Laterality Date  . Right hand surgery  2011    Abscess drainage  . Colonoscopy  05/19/2012    Procedure: COLONOSCOPY;  Surgeon: Corbin Ade, MD;  [facility];  Service: Endoscopy;  Laterality: N/A;  9:30 AM    Current Outpatient Prescriptions  Medication Sig Dispense Refill  . amLODipine (NORVASC) 5 MG tablet Take 1 tablet (5 mg total) by mouth daily. 90 tablet 3  . folic acid (FOLVITE) 1 MG tablet Take 1 mg by mouth daily.    . ramipril (ALTACE) 5 MG capsule Take 1 capsule (5 mg total) by mouth daily. 90 capsule 3  . traMADol (ULTRAM) 50 MG tablet Take by mouth 2 (two) times daily.     No current facility-administered medications for this visit.   Allergies:  Review of patient's allergies indicates no known allergies.   Social  History: The patient  reports that he has been smoking Cigarettes.  He started smoking about 43 years ago. He has a 22 pack-year smoking history. He has never used smokeless tobacco. He reports that he drinks about 6.0 oz of alcohol per week. He reports that he does not use illicit drugs.   ROS:  Please see the history of present illness. Otherwise, complete review of systems is positive for bronchitis in February. All other systems are reviewed and negative.   Physical Exam: VS:  BP 104/60 mmHg  Pulse 55  Ht 5\' 4"  (1.626 m)  Wt 106 lb (48.081 kg)  BMI 18.19 kg/m2  SpO2 96%, BMI Body mass index is 18.19 kg/(m^2).  Wt Readings from Last 3 Encounters:  10/29/15 106 lb (48.081 kg)  09/15/15 105 lb (47.628 kg)  04/30/15 104 lb 9.6 oz (47.446 kg)    General: Patient appears comfortable at rest. HEENT: Conjunctiva and lids normal, oropharynx clear. Neck: Supple, no elevated JVP or carotid bruits, no thyromegaly. Lungs: Clear to auscultation, nonlabored breathing at rest. Cardiac: Regular rate and rhythm, no S3 or significant systolic murmur, no pericardial rub. Abdomen: Soft, nontender, bowel sounds present, no guarding or rebound. Extremities: No pitting edema, distal pulses 2+.  ECG: I personally reviewed the prior tracing from 02/18/2015 which showed sinus rhythm with anteroseptal Q waves.  Recent Labwork: 02/18/2015: ALT 28; AST  30; BUN 9; Creatinine, Ser 0.83; Hemoglobin 14.0; Platelets 267; Potassium 3.4*; Sodium 138   Other Studies Reviewed Today:  Echocardiogram 05/02/2015: Study Conclusions  - Left ventricle: The cavity size was normal. Wall thickness was  normal. Systolic function was normal. The estimated ejection  fraction was in the range of 50% to 55%. Wall motion was normal;  there were no regional wall motion abnormalities. Left  ventricular diastolic function parameters were normal. - Left atrium: The atrium was mildly dilated.  Assessment and Plan:  1.  Nonischemic cardiomyopathy with improvement in LVEF to the range of 50-55%. Plan is to continue current regimen. He is not on beta blocker with low resting heart rate.  2. PSVT, fortunately quiescent with no significant palpitations or documented arrhythmia.  3. Alcohol abuse, patient states that he has cut back substantially.  4. Essential hypertension, blood pressure is normal today.  Current medicines were reviewed with the patient today.  Disposition: FU with me in 6 months.   Signed, Jonelle Sidle, MD, [facility] 10/29/2015 2:18 PM    [facility] at [facility] [address] [place], [place] [address] Phone: [phone]; Fax: [phone]

## 2015-11-02 ENCOUNTER — Other Ambulatory Visit: Payer: Self-pay | Admitting: Cardiology

## 2016-02-05 ENCOUNTER — Emergency Department (HOSPITAL_COMMUNITY): Payer: Medicaid Other

## 2016-02-05 ENCOUNTER — Encounter (HOSPITAL_COMMUNITY): Payer: Self-pay | Admitting: Emergency Medicine

## 2016-02-05 ENCOUNTER — Emergency Department (HOSPITAL_COMMUNITY)
Admission: EM | Admit: 2016-02-05 | Discharge: 2016-02-05 | Disposition: A | Discharge status: Home / Self Care | Payer: Medicaid Other | Attending: Emergency Medicine | Admitting: Emergency Medicine

## 2016-02-05 DIAGNOSIS — S99912A Unspecified injury of left ankle, initial encounter: Secondary | ICD-10-CM | POA: Diagnosis not present

## 2016-02-05 DIAGNOSIS — I10 Essential (primary) hypertension: Secondary | ICD-10-CM | POA: Diagnosis not present

## 2016-02-05 DIAGNOSIS — Y999 Unspecified external cause status: Secondary | ICD-10-CM | POA: Diagnosis not present

## 2016-02-05 DIAGNOSIS — W208XXA Other cause of strike by thrown, projected or falling object, initial encounter: Secondary | ICD-10-CM | POA: Insufficient documentation

## 2016-02-05 DIAGNOSIS — S99922A Unspecified injury of left foot, initial encounter: Secondary | ICD-10-CM

## 2016-02-05 DIAGNOSIS — M25572 Pain in left ankle and joints of left foot: Secondary | ICD-10-CM | POA: Diagnosis present

## 2016-02-05 DIAGNOSIS — Z79899 Other long term (current) drug therapy: Secondary | ICD-10-CM | POA: Insufficient documentation

## 2016-02-05 DIAGNOSIS — Y939 Activity, unspecified: Secondary | ICD-10-CM | POA: Insufficient documentation

## 2016-02-05 DIAGNOSIS — F17210 Nicotine dependence, cigarettes, uncomplicated: Secondary | ICD-10-CM | POA: Insufficient documentation

## 2016-02-05 DIAGNOSIS — Y929 Unspecified place or not applicable: Secondary | ICD-10-CM | POA: Insufficient documentation

## 2016-02-05 MED ORDER — NAPROXEN 250 MG PO TABS
500.0000 mg | ORAL_TABLET | Freq: Once | ORAL | Status: AC
  Administered 2016-02-05: 500 mg via ORAL
  Filled 2016-02-05: qty 2

## 2016-02-05 NOTE — Discharge Instructions (Signed)
You x-ray does not show broken broken.  Keep compression dressing over this, keep leg elevated at rest, and ice to keep swelling down. Use crutches as needed and bear weight on leg as tolerated. Take ibuprofen and tylenol as needed for pain.  Return for worsening symptoms, including increased redness/swelling, worsening pain, or any other symptoms concerning to you.

## 2016-02-05 NOTE — ED Provider Notes (Signed)
CSN: [id number]     Arrival date & time 02/05/16  0802 History  By signing my name below, I, [facility], attest that this documentation has been prepared under the direction and in the presence of Lavera Guise, MD. Electronically Signed: Randell Patient, ED Scribe. 02/05/2016. 8:48 AM.   Chief Complaint  Patient presents with  . Foot Injury    The history is provided by the patient. No language interpreter was used.   HPI Comments: Christopher Austin is a 60 y.o. male with a hx of HTN, nonischemic cardiomyopathy EF 50-55%, and PSVT who presents to the Emergency Department complaining of constant, moderate, unchanging left foot pain onset 1 week ago after an injury. Pt states that he dropped on a dolly on his foot one week ago, followed immediately by pain and swelling of his left foot, the latter of which has mildly improved over the past few days. He reports associated redness to his left ankle and foot, slightly improving as well. Pain is worse with movement and weight bearing. He has been able to ambulate with difficulty using crutches. Denies taking any pain medications at home. Denies any other injuries. Denies numbness, paraesthesias, or any other symptoms currently.  Past Medical History  Diagnosis Date  . Essential hypertension, benign   . 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 PSVT (paroxysmal supraventricular tachycardia) (HCC)     Noted during exercise testing  . Bradycardia     No clear evidence of chronotropic incompetence  . History of seizures   . Headache(784.0)   . History of alcohol abuse   . Chronic pain    Past Surgical History  Procedure Laterality Date  . Right hand surgery  2011    Abscess drainage  . Colonoscopy  05/19/2012    Procedure: COLONOSCOPY;  Surgeon: Corbin Ade, MD;  [facility];  Service: Endoscopy;  Laterality: N/A;  9:30 AM   Family History  Problem Relation Age of Onset  . Hypertension Mother   .  Hyperlipidemia Mother    Social History  Substance Use Topics  . Smoking status: Current Every Day Smoker -- 1.00 packs/day for 22 years    Types: Cigarettes    Start date: 07/28/1972  . Smokeless tobacco: Never Used  . Alcohol Use: 6.0 oz/week    0 Standard drinks or equivalent, 10 Cans of beer per week    Review of Systems  Musculoskeletal: Positive for myalgias (left foot).  Skin: Positive for color change (erythema).  Neurological: Negative for numbness.      Allergies  Review of patient's allergies indicates no known allergies.  Home Medications   Prior to Admission medications   Medication Sig Start Date End Date Taking? Authorizing Provider  amLODipine (NORVASC) 5 MG tablet Take 1 tablet (5 mg total) by mouth daily. 11/01/14  Yes Jonelle Sidle, MD  folic acid (FOLVITE) 1 MG tablet Take 1 mg by mouth daily.   Yes Historical Provider, MD  ramipril (ALTACE) 5 MG capsule TAKE ONE CAPSULE BY MOUTH ONCE DAILY 11/05/15  Yes Jonelle Sidle, MD  traMADol (ULTRAM) 50 MG tablet Take by mouth 2 (two) times daily.   Yes Historical Provider, MD   BP 158/93 mmHg  Pulse 90  Temp(Src) 98 F (36.7 C) (Oral)  Resp 18  Ht  (1.626 m)  Wt 106 lb (48.081 kg)  BMI 18.19 kg/m2  SpO2 96% Physical Exam Physical Exam  Nursing note and vitals reviewed.  Constitutional: Well developed, well nourished, non-toxic, and in no acute distress Head: Normocephalic and atraumatic.  Mouth/Throat: Oropharynx is clear and moist.  Neck: Normal range of motion. Neck supple.  Cardiovascular: Normal rate and regular rhythm.  2+ DP and PT pulses bilaterally. Pulmonary/Chest: Effort normal and breath sounds normal.  Abdominal: Soft. There is no tenderness. There is no rebound and no guarding.  Musculoskeletal: Normal range of motion. Erythema and soft tissue swelling over the left lateral malleolus extending over the dorsum of the left foot, no deformities, pain with ROM of left  ankle. Neurological: Alert, no facial droop, fluent speech, moves all extremities symmetrically, sensation intact to light touch over left lower extremity Skin: Skin is warm and dry.  Psychiatric: Cooperative  ED Course  Procedures   DIAGNOSTIC STUDIES: Oxygen Saturation is 96% on RA, adequate by my interpretation.    COORDINATION OF CARE: 8:17 AM Will order pain medication and x-rays of left foot and ankle. Discussed treatment plan with pt at bedside and pt agreed to plan.  Imaging Review Dg Ankle Complete Left  02/05/2016  CLINICAL DATA:  The patient dropped a dolly on his left foot 1 week ago while moving. Pain and swelling. Initial encounter. EXAM: LEFT ANKLE COMPLETE - 3+ VIEW COMPARISON:  None. FINDINGS: There is some soft tissue swelling over the dorsum of the ankle. No underlying bony or joint abnormality is identified. Atherosclerotic calcifications are noted. IMPRESSION: Dorsal soft tissue swelling without underlying bony or joint abnormality. Atherosclerosis. Electronically Signed   By: Drusilla Kanner M.D.   On: 02/05/2016 08:41   Dg Foot Complete Left  02/05/2016  CLINICAL DATA:  60 year old male with a history of foot injury EXAM: LEFT FOOT - COMPLETE 3+ VIEW COMPARISON:  None. FINDINGS: No acute fracture. No focal soft tissue swelling. No radiopaque foreign body. Hallux valgus deformity. Mild degenerative changes. Vascular calcifications of the distal anterior tibial artery. IMPRESSION: No acute bony abnormality identified. Signed, Yvone Neu. Loreta Ave, DO [facility] Electronically Signed   By: Gilmer Mor D.O.   On: 02/05/2016 08:39   I have personally reviewed and evaluated these images and lab results as part of my medical decision-making.   MDM   Final diagnoses:  Ankle injury, left, initial encounter  Foot injury, left, initial encounter     X-ray without fracture. Just soft tissue swelling. Mild erythema,  warmth, bruising over left ankle and dorsum of foot. Neurovascularly in tact. Will continue supportive care, RICE, weight bear as tolerated. Will follow-up with PCP closely. Strict return and follow-up instructions reviewed. He expressed understanding of all discharge instructions and felt comfortable with the plan of care.   I personally performed the services described in this documentation, which was scribed in my presence. The recorded information has been reviewed and is accurate.   Lavera Guise, MD 02/05/16 [id number]

## 2016-02-05 NOTE — ED Notes (Signed)
Pt reports dropping a dolly on his LT foot 1 weeks ago. Pt noted to edema and erythema to LT foot. Pt states pain has not improved despite home interventions. Pt using crutches upon arrival that he had from a previous injury.

## 2016-02-05 NOTE — ED Notes (Signed)
Pt refused wheelchair to car.  States he wants to walk on his crutches.  Advised to let staff know if he changed his mind.

## 2016-02-14 ENCOUNTER — Encounter (HOSPITAL_COMMUNITY): Payer: Self-pay | Admitting: Emergency Medicine

## 2016-02-14 ENCOUNTER — Emergency Department (HOSPITAL_COMMUNITY)
Admission: EM | Admit: 2016-02-14 | Discharge: 2016-02-14 | Disposition: A | Discharge status: Home / Self Care | Payer: Medicaid Other | Attending: Emergency Medicine | Admitting: Emergency Medicine

## 2016-02-14 DIAGNOSIS — Z79899 Other long term (current) drug therapy: Secondary | ICD-10-CM | POA: Diagnosis not present

## 2016-02-14 DIAGNOSIS — F17210 Nicotine dependence, cigarettes, uncomplicated: Secondary | ICD-10-CM | POA: Insufficient documentation

## 2016-02-14 DIAGNOSIS — L298 Other pruritus: Secondary | ICD-10-CM | POA: Diagnosis present

## 2016-02-14 DIAGNOSIS — I10 Essential (primary) hypertension: Secondary | ICD-10-CM | POA: Insufficient documentation

## 2016-02-14 DIAGNOSIS — L309 Dermatitis, unspecified: Secondary | ICD-10-CM

## 2016-02-14 DIAGNOSIS — L259 Unspecified contact dermatitis, unspecified cause: Secondary | ICD-10-CM | POA: Diagnosis not present

## 2016-02-14 MED ORDER — DIPHENHYDRAMINE HCL 25 MG PO CAPS
25.0000 mg | ORAL_CAPSULE | Freq: Once | ORAL | Status: AC
  Administered 2016-02-14: 25 mg via ORAL
  Filled 2016-02-14: qty 1

## 2016-02-14 MED ORDER — PREDNISONE 50 MG PO TABS
60.0000 mg | ORAL_TABLET | Freq: Once | ORAL | Status: AC
  Administered 2016-02-14: 60 mg via ORAL
  Filled 2016-02-14: qty 1

## 2016-02-14 MED ORDER — FAMOTIDINE 20 MG PO TABS: 20.0000 mg | ORAL_TABLET | Freq: Two times a day (BID) | ORAL | Status: DC

## 2016-02-14 MED ORDER — DIPHENHYDRAMINE HCL 25 MG PO TABS: 25.0000 mg | ORAL_TABLET | Freq: Four times a day (QID) | ORAL | Status: DC | PRN

## 2016-02-14 MED ORDER — PREDNISONE 10 MG PO TABS: ORAL_TABLET | ORAL | Status: DC

## 2016-02-14 NOTE — ED Notes (Signed)
Pt states he has been itching all over since waking up this morning.  States his eyes are puffy as well.  Denies sob/sensation of throat closing.  Was cutting grass yesterday.

## 2016-02-14 NOTE — Discharge Instructions (Signed)
Rash A rash is a change in the color or texture of the skin. There are many different types of rashes. You may have other problems that accompany your rash. CAUSES   Infections.  Allergic reactions. This can include allergies to pets or foods.  Certain medicines.  Exposure to certain chemicals, soaps, or cosmetics.  Heat.  Exposure to poisonous plants.  Tumors, both cancerous and noncancerous. SYMPTOMS   Redness.  Scaly skin.  Itchy skin.  Dry or cracked skin.  Bumps.  Blisters.  Pain. DIAGNOSIS  Your caregiver may do a physical exam to determine what type of rash you have. A skin sample (biopsy) may be taken and examined under a microscope. TREATMENT  Treatment depends on the type of rash you have. Your caregiver may prescribe certain medicines. For serious conditions, you may need to see a skin doctor (dermatologist). HOME CARE INSTRUCTIONS   Avoid the substance that caused your rash.  Do not scratch your rash. This can cause infection.  You may take cool baths to help stop itching.  Only take over-the-counter or prescription medicines as directed by your caregiver.  Keep all follow-up appointments as directed by your caregiver. SEEK IMMEDIATE MEDICAL CARE IF:  You have increasing pain, swelling, or redness.  You have a fever.  You have new or severe symptoms.  You have body aches, diarrhea, or vomiting.  Your rash is not better after 3 days. MAKE SURE YOU:  Understand these instructions.  Will watch your condition.  Will get help right away if you are not doing well or get worse.   This information is not intended to replace advice given to you by your health care provider. Make sure you discuss any questions you have with your health care provider.   Document Released: 07/04/2002 Document Revised: 08/04/2014 Document Reviewed: 11/29/2014 Elsevier Interactive Patient Education 2016 [employer].   Take your next dose of prednisone tomorrow  morning.  Start your Pepcid today.  You may take Benadryl every 6 hours if needed for itching.

## 2016-02-14 NOTE — ED Provider Notes (Signed)
CSN: [id number]     Arrival date & time 02/14/16  4332 History   First MD Initiated Contact with Patient 02/14/16 1052     Chief Complaint  Patient presents with  . Pruritis     (Consider location/radiation/quality/duration/timing/severity/associated sxs/prior Treatment) The history is provided by the patient and the spouse.   Christopher Austin is a 60 y.o. male presenting with all over itching which has been present for the past week, and tends to worsen at night.  He woke this morning with periorbital swelling and itching as well which has improved some after applying cool compresses.  A week ago he used Psychologist, clinical in his laundry which she suspects as possible culprit, has since stopped using this and has rewashed all his clothing.  He additionally works in Therapist, [employer] care and was [employer] yesterday, but recognizes poison oak/ivy and denies any exposure to these weeds.  He has had no medications prior to arrival for the itching.  He denies rash, but wife points out thickened excoriated appearing skin on his posterior neck.  He denies fevers or chills, no mouth tongue or throat swelling, no shortness of breath or wheezing.    Past Medical History  Diagnosis Date  . Essential hypertension, benign   . 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 PSVT (paroxysmal supraventricular tachycardia) (HCC)     Noted during exercise testing  . Bradycardia     No clear evidence of chronotropic incompetence  . History of seizures   . Headache(784.0)   . History of alcohol abuse   . Chronic pain    Past Surgical History  Procedure Laterality Date  . Right hand surgery  2011    Abscess drainage  . Colonoscopy  05/19/2012    Procedure: COLONOSCOPY;  Surgeon: Corbin Ade, MD;  Location: [facility];  Service: Endoscopy;  Laterality: N/A;  9:30 AM   Family History  Problem Relation Age of Onset  . Hypertension Mother   . Hyperlipidemia Mother    Social History  Substance Use Topics   . Smoking status: Current Every Day Smoker -- 1.00 packs/day for 22 years    Types: Cigarettes    Start date: 07/28/1972  . Smokeless tobacco: Never Used  . Alcohol Use: 6.0 oz/week    0 Standard drinks or equivalent, 10 Cans of beer per week    Review of Systems  Constitutional: Negative for fever and chills.  Respiratory: Negative for shortness of breath and wheezing.   Skin: Negative for color change.  Neurological: Negative for numbness.      Allergies  Review of patient's allergies indicates no known allergies.  Home Medications   Prior to Admission medications   Medication Sig Start Date End Date Taking? Authorizing Provider  amLODipine (NORVASC) 5 MG tablet Take 1 tablet (5 mg total) by mouth daily. 11/01/14  Yes Jonelle Sidle, MD  folic acid (FOLVITE) 1 MG tablet Take 1 mg by mouth daily.   Yes Historical Provider, MD  ramipril (ALTACE) 5 MG capsule TAKE ONE CAPSULE BY MOUTH ONCE DAILY 11/05/15  Yes Jonelle Sidle, MD  sildenafil (REVATIO) 20 MG tablet Take 20 mg by mouth daily.   Yes Historical Provider, MD  diphenhydrAMINE (BENADRYL) 25 MG tablet Take 1 tablet (25 mg total) by mouth every 6 (six) hours as needed for itching. 02/14/16   [name], PA-C  famotidine (PEPCID) 20 MG tablet Take 1 tablet (20 mg total) by mouth 2 (two) times  daily. 02/14/16   [name], PA-C  predniSONE (DELTASONE) 10 MG tablet 6, 5, 4, 3, 2 then 1 tablet by mouth daily for 6 days total. 02/15/16   [name], PA-C   BP 115/80 mmHg  Pulse 61  Temp(Src) 98.5 F (36.9 C) (Oral)  Resp 18  Ht 5\' 4"  (1.626 m)  Wt 48.081 kg  BMI 18.19 kg/m2  SpO2 100% Physical Exam  Constitutional: He appears well-developed and well-nourished. No distress.  HENT:  Head: Normocephalic.  Neck: Neck supple.  Cardiovascular: Normal rate.   Pulmonary/Chest: Effort normal. He has no wheezes.  Musculoskeletal: Normal range of motion. He exhibits no edema.  Skin: There is erythema.  Mild erythema and edema  of his cheeks at the nasolabial fold.  Mild soft.  Orbital edema without erythema.  Posterior neck appears excoriated, mild hypertrophy.  No rash appreciated.    ED Course  Procedures (including critical care time) Labs Review Labs Reviewed - No data to display  Imaging Review No results found. I have personally reviewed and evaluated these images and lab results as part of my medical decision-making.   EKG Interpretation None      MDM   Final diagnoses:  Dermatitis    Patient presenting with pruritus of unclear etiology, probable contact dermatitis , could also be atopic.  Patient was placed on a prednisone taper, advised Pepcid and Benadryl when necessary itching.  Plan follow-up with PCP if not improving with this treatment.    [name], PA-C 02/14/16 1118  Mancel Bale, MD 02/14/16 [phone]

## 2016-03-09 ENCOUNTER — Encounter (HOSPITAL_COMMUNITY): Payer: Self-pay | Admitting: Emergency Medicine

## 2016-03-09 ENCOUNTER — Emergency Department (HOSPITAL_COMMUNITY)
Admission: EM | Admit: 2016-03-09 | Discharge: 2016-03-09 | Disposition: A | Discharge status: Home / Self Care | Payer: Medicaid Other | Attending: Emergency Medicine | Admitting: Emergency Medicine

## 2016-03-09 DIAGNOSIS — Z79899 Other long term (current) drug therapy: Secondary | ICD-10-CM | POA: Insufficient documentation

## 2016-03-09 DIAGNOSIS — F17210 Nicotine dependence, cigarettes, uncomplicated: Secondary | ICD-10-CM | POA: Insufficient documentation

## 2016-03-09 DIAGNOSIS — L299 Pruritus, unspecified: Secondary | ICD-10-CM | POA: Insufficient documentation

## 2016-03-09 DIAGNOSIS — I10 Essential (primary) hypertension: Secondary | ICD-10-CM | POA: Diagnosis not present

## 2016-03-09 MED ORDER — DEXAMETHASONE 4 MG PO TABS: 4.0000 mg | ORAL_TABLET | Freq: Two times a day (BID) | ORAL | 0 refills | Status: DC

## 2016-03-09 MED ORDER — TRIAMCINOLONE 0.1 % CREAM:EUCERIN CREAM 1:1: 1.0000 "application " | TOPICAL_CREAM | Freq: Two times a day (BID) | CUTANEOUS | 1 refills | Status: DC

## 2016-03-09 MED ORDER — HYDROXYZINE PAMOATE 25 MG PO CAPS
25.0000 mg | ORAL_CAPSULE | Freq: Four times a day (QID) | ORAL | 0 refills | Status: DC | PRN
Start: 2016-03-09 — End: 2016-04-04

## 2016-03-09 NOTE — ED Triage Notes (Signed)
Patient c/o itching to entire body. Denies rash, insect bites, or fevers. Per patient itching progressively getting worse. Per patient had to drink alcoholic beverage prior to coming to ER to "calm" the itching.

## 2016-03-09 NOTE — ED Provider Notes (Signed)
[facility] Provider Note   CSN: [id number] Arrival date & time: 03/09/16  1456  Provider First Contact: 4:10 PM  By signing my name below, I, Vista Mink, attest that this documentation has been prepared under the direction and in the presence of [employer].  Electronically Signed: Vista Mink, ED Scribe. 03/09/16. 4:54 PM.    History   Chief Complaint Chief Complaint  Patient presents with  . Pruritis   HPI Comments: Christopher Austin is a 60 y.o. male who presents to the Emergency Department complaining of gradually worsening, generalized pruritis; onset four weeks ago. Pt was seen on 02/14/16 for the same symptoms and was prescribed Benadryl and Prednisone. Pt complains that his symptoms have gradually worsened over the past 3 days and is here for evaluation. Pt states that he is having trouble sleeping at night due to a feeling of "itching all over". Pt reports, "it feels like something is crawling all over me". Pt states he starts itching at certain places on his arms and legs and the itching starts to "spread". Pt report intermittent episodes of the symptoms during the day; not just at night. Pt also reports that he has some hives on the back of his neck. Pt denies any Hx of similar symptoms. Pt further denies new linens, soaps, detergents, foods. Denies Hx of kidney problems but states that he has had liver problems in the past. PCP is Dr. Felecia Shelling     The history is provided by the patient. No language interpreter was used.    Past Medical History:  Diagnosis Date  . Bradycardia    No clear evidence of chronotropic incompetence  . 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 Chronic pain   . Essential hypertension, benign   . Headache(784.0)   . History of alcohol abuse   . History of seizures   . PSVT (paroxysmal supraventricular tachycardia) (HCC)    Noted during exercise testing    Patient Active Problem List   Diagnosis Date Noted  . Chest pain  02/18/2015  . Tobacco abuse 03/20/2014  . PSVT (paroxysmal supraventricular tachycardia) (HCC) 06/11/2012  . Essential hypertension, benign 05/20/2012  . Bradycardia 05/20/2012  . Cardiomyopathy (HCC) 05/20/2012    Past Surgical History:  Procedure Laterality Date  . COLONOSCOPY  05/19/2012   Procedure: COLONOSCOPY;  Surgeon: Corbin Ade, MD;  Location[facility];  Service: Endoscopy;  Laterality: N/A;  9:30 AM  . Right hand surgery  2011   Abscess drainage     Home Medications    Prior to Admission medications   Medication Sig Start Date End Date Taking? Authorizing Provider  amLODipine (NORVASC) 5 MG tablet Take 1 tablet (5 mg total) by mouth daily. 11/01/14   Jonelle Sidle, MD  diphenhydrAMINE (BENADRYL) 25 MG tablet Take 1 tablet (25 mg total) by mouth every 6 (six) hours as needed for itching. 02/14/16   [name], PA-C  famotidine (PEPCID) 20 MG tablet Take 1 tablet (20 mg total) by mouth 2 (two) times daily. 02/14/16   [name], PA-C  folic acid (FOLVITE) 1 MG tablet Take 1 mg by mouth daily.    Historical Provider, MD  predniSONE (DELTASONE) 10 MG tablet 6, 5, 4, 3, 2 then 1 tablet by mouth daily for 6 days total. 02/15/16   [name], PA-C  ramipril (ALTACE) 5 MG capsule TAKE ONE CAPSULE BY MOUTH ONCE DAILY 11/05/15   Jonelle Sidle, MD  sildenafil (REVATIO) 20 MG  tablet Take 20 mg by mouth daily.    Historical Provider, MD    Family History Family History  Problem Relation Age of Onset  . Hypertension Mother   . Hyperlipidemia Mother     Social History Social History  Substance Use Topics  . Smoking status: Current Every Day Smoker    Packs/day: 1.00    Years: 22.00    Types: Cigarettes    Start date: 07/28/1972  . Smokeless tobacco: Never Used  . Alcohol use 6.0 oz/week    10 Cans of beer per week     Allergies   Review of patient's allergies indicates no known allergies.   Review of Systems Review of Systems  Constitutional: Negative for  fever.  Skin: Negative for rash.       + Itching all over - insect bites  All other systems reviewed and are negative.   Physical Exam Updated Vital Signs BP 133/79 (BP Location: Left Arm)   Pulse 74   Temp 98.2 F (36.8 C) (Oral)   Resp 18   Ht  (1.626 m)   Wt 103 lb (46.7 kg)   SpO2 95%   BMI 17.68 kg/m   Physical Exam  Constitutional: He is oriented to person, place, and time. He appears well-developed and well-nourished. No distress.  HENT:  Head: Normocephalic and atraumatic.  Eyes: Conjunctivae are normal.  Neck: Normal range of motion.  Cardiovascular: Normal rate and regular rhythm.   No murmur heard. Pulmonary/Chest: Effort normal and breath sounds normal. No respiratory distress. He has no wheezes.  Musculoskeletal: Normal range of motion.  Neurological: He is alert and oriented to person, place, and time. Coordination normal.  Skin: Skin is warm and dry. He is not diaphoretic.  Dry skin patches in the antecubital and in the web spaces. Few resolving hives on back of neck and mid back. Dry skin patches around the ankles.  Psychiatric: He has a normal mood and affect. His behavior is normal. Judgment normal.  Nursing note and vitals reviewed.    ED Treatments / Results  DIAGNOSTIC STUDIES: Oxygen Saturation is 95% on RA, adequate by my interpretation.    COORDINATION OF CARE: 4:20 PM Discussed treatment plan with pt at bedside which includes Decadron, Vistaril, and return precautions; pt agreed to plan.  Labs (all labs ordered are listed, but only abnormal results are displayed) Labs Reviewed - No data to display  EKG  EKG Interpretation None       Radiology No results found.  Procedures Procedures (including critical care time)  Medications Ordered in ED Medications - No data to display   Initial Impression / Assessment and Plan / ED Course  I have reviewed the triage vital signs and the nursing notes.  Pertinent labs & imaging  results that were available during my care of the patient were reviewed by me and considered in my medical decision making (see chart for details).  Clinical Course    **I have reviewed nursing notes, vital signs, and all appropriate lab and imaging results for this patient.*  Final Clinical Impressions(s) / ED Diagnoses The patient denies any recent changes in his diet, medications, or environment. He was seen for similar symptoms on July 20. He states that his medication helped some but did not resolve the issue. He returns now because the issue seems to be worse instead of better.  The patient's pruritus is treated with triamcinolone/Eucerin cream on. Steroids, and Vistaril. The patient is referred to Dr. Hall-dermatology  for additional evaluation and management.    Final diagnoses:  Pruritus    New Prescriptions New Prescriptions   No medications on file   **I have reviewed nursing notes, vital signs, and all appropriate lab and imaging results for this patient.Ivery Quale, PA-C 03/10/16 2315    Donnetta Hutching, MD 03/12/16 [phone]

## 2016-03-09 NOTE — Discharge Instructions (Signed)
Please speak with your MD or Dr Margo Aye, or a member of his team concerning your itching. Use triamcinolone 2 times daily to back of neck, back, arms and legs. Use decadron 2 times daily with food. Use vistaril for itching. This medication may cause drowsiness. Please do not drink, drive, or participate in activity that requires concentration while taking this medication.

## 2016-04-04 ENCOUNTER — Encounter (HOSPITAL_COMMUNITY): Payer: Self-pay

## 2016-04-04 ENCOUNTER — Emergency Department (HOSPITAL_COMMUNITY)
Admission: EM | Admit: 2016-04-04 | Discharge: 2016-04-04 | Disposition: A | Discharge status: Home / Self Care | Payer: Medicaid Other | Attending: Emergency Medicine | Admitting: Emergency Medicine

## 2016-04-04 DIAGNOSIS — L509 Urticaria, unspecified: Secondary | ICD-10-CM | POA: Diagnosis present

## 2016-04-04 DIAGNOSIS — Z79899 Other long term (current) drug therapy: Secondary | ICD-10-CM | POA: Diagnosis not present

## 2016-04-04 DIAGNOSIS — F17210 Nicotine dependence, cigarettes, uncomplicated: Secondary | ICD-10-CM | POA: Insufficient documentation

## 2016-04-04 DIAGNOSIS — I10 Essential (primary) hypertension: Secondary | ICD-10-CM | POA: Diagnosis not present

## 2016-04-04 MED ORDER — DIPHENHYDRAMINE HCL 25 MG PO CAPS
50.0000 mg | ORAL_CAPSULE | Freq: Once | ORAL | Status: AC
  Administered 2016-04-04: 50 mg via ORAL

## 2016-04-04 MED ORDER — DIPHENHYDRAMINE HCL 25 MG PO CAPS
ORAL_CAPSULE | ORAL | Status: AC
  Filled 2016-04-04: qty 2

## 2016-04-04 MED ORDER — DIPHENHYDRAMINE HCL 25 MG PO CAPS
25.0000 mg | ORAL_CAPSULE | Freq: Four times a day (QID) | ORAL | 0 refills | Status: DC | PRN
Start: 2016-04-04 — End: 2017-04-15

## 2016-04-04 NOTE — ED Provider Notes (Signed)
By signing my name below, I, Phillis Haggis, attest that this documentation has been prepared under the direction and in the presence of Aidynn Polendo N Kaneesha Constantino, DO. Electronically Signed: Phillis Haggis, ED Scribe. 04/04/16. 12:58 AM.  TIME SEEN: 12:52 AM  CHIEF COMPLAINT:  Chief Complaint  Patient presents with  . Urticaria   HPI:  HPI Comments: Christopher Austin is a 60 y.o. Male with a hx of HTN and alcohol abuse who presents to the Emergency Department complaining of a recurrent, itching, diffuse rash onset 3 weeks ago, worsening yesterday. Pt states that he has used the Triamcinolone cream prescribed to him on his last visit to no relief. He has also taken steroids in the past to relief, but the rash will return soon after. He has not taken anything for the itching. Pt has not seen a dermatologist for these symptoms. He denies fever, chills, SOB, Lip or tongue swelling, or trouble swallowing.  Denies any new exposures.   ROS: See HPI Constitutional: no fever  Eyes: no drainage  ENT: no runny nose   Cardiovascular:  no chest pain  Resp: no SOB  GI: no vomiting GU: no dysuria Integumentary: rash  Allergy: hives  Musculoskeletal: no leg swelling  Neurological: no slurred speech ROS otherwise negative  PAST MEDICAL HISTORY/PAST SURGICAL HISTORY:  Past Medical History:  Diagnosis Date  . Bradycardia    No clear evidence of chronotropic incompetence  . 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 Chronic pain   . Essential hypertension, benign   . Headache(784.0)   . History of alcohol abuse   . History of seizures   . PSVT (paroxysmal supraventricular tachycardia) (HCC)    Noted during exercise testing    MEDICATIONS:  Prior to Admission medications   Medication Sig Start Date End Date Taking? Authorizing Provider  amLODipine (NORVASC) 5 MG tablet Take 1 tablet (5 mg total) by mouth daily. 11/01/14   Jonelle Sidle, MD  dexamethasone (DECADRON) 4 MG tablet Take 1 tablet (4  mg total) by mouth 2 (two) times daily with a meal. 03/09/16   Ivery Quale, PA-C  diphenhydrAMINE (BENADRYL) 25 MG tablet Take 1 tablet (25 mg total) by mouth every 6 (six) hours as needed for itching. 02/14/16   [name], PA-C  famotidine (PEPCID) 20 MG tablet Take 1 tablet (20 mg total) by mouth 2 (two) times daily. 02/14/16   [name], PA-C  folic acid (FOLVITE) 1 MG tablet Take 1 mg by mouth daily.    Historical Provider, MD  hydrOXYzine (VISTARIL) 25 MG capsule Take 1 capsule (25 mg total) by mouth every 6 (six) hours as needed for itching. 03/09/16   Ivery Quale, PA-C  predniSONE (DELTASONE) 10 MG tablet 6, 5, 4, 3, 2 then 1 tablet by mouth daily for 6 days total. 02/15/16   [name], PA-C  ramipril (ALTACE) 5 MG capsule TAKE ONE CAPSULE BY MOUTH ONCE DAILY 11/05/15   Jonelle Sidle, MD  sildenafil (REVATIO) 20 MG tablet Take 20 mg by mouth daily.    Historical Provider, MD  Triamcinolone Acetonide (TRIAMCINOLONE 0.1 % CREAM : EUCERIN) CREA Apply 1 application topically 2 (two) times daily. 03/09/16   Ivery Quale, PA-C    ALLERGIES:  No Known Allergies  SOCIAL HISTORY:  Social History  Substance Use Topics  . Smoking status: Current Every Day Smoker    Packs/day: 1.00    Years: 22.00    Types: Cigarettes    Start date: 07/28/1972  .  Smokeless tobacco: Never Used  . Alcohol use 6.0 oz/week    10 Cans of beer per week    FAMILY HISTORY: Family History  Problem Relation Age of Onset  . Hypertension Mother   . Hyperlipidemia Mother     EXAM: BP 128/86 (BP Location: Right Arm)   Pulse (!) 58   Temp 97.7 F (36.5 C) (Oral)   Resp 15   Ht 5\' 4"  (1.626 m)   Wt 103 lb (46.7 kg)   SpO2 96%   BMI 17.68 kg/m  CONSTITUTIONAL: Alert and oriented and responds appropriately to questions. Well-appearing; well-nourished, Afebrile, nontoxic HEAD: Normocephalic EYES: Conjunctivae clear, PERRL ENT: normal nose; no rhinorrhea; moist mucous membranes; No pharyngeal erythema or  petechiae, no tonsillar hypertrophy or exudate, no uvular deviation, no trismus or drooling, normal phonation, no stridor, no Ludwig's angina, tongue sits flat in the bottom of the mouth, no angioedema, no facial erythema or warmth, no facial swelling NECK: Supple, no meningismus, no LAD  CARD: RRR; S1 and S2 appreciated; no murmurs, no clicks, no rubs, no gallops RESP: Normal chest excursion without splinting or tachypnea; breath sounds clear and equal bilaterally; no wheezes, no rhonchi, no rales, no hypoxia or respiratory distress, speaking full sentences ABD/GI: Normal bowel sounds; non-distended; soft, non-tender, no rebound, no guarding, no peritoneal signs BACK:  The back appears normal and is non-tender to palpation, there is no CVA tenderness EXT: Normal ROM in all joints; non-tender to palpation; no edema; normal capillary refill; no cyanosis, no calf tenderness or swelling    SKIN: Normal color for age and race; warm; minimal scattered urticaria to extremities and torso; no petechiae or purpura. No blisters or desquamation. No rash involving the palms, soles or mucous membranes. No vesicular lesions. No erythema, warmth, fluctuance or induration. NEURO: Moves all extremities equally, sensation to light touch intact diffusely, cranial nerves II through XII intact PSYCH: The patient's mood and manner are appropriate. Grooming and personal hygiene are appropriate.  MEDICAL DECISION MAKING: Patient has very mild scattered urticaria to his extremities and torso. No rash involving his palms, soles or mucous membranes.  No other sign of life-threatening allergic reaction. This is his third visit in the past 3 months for the same. He states symptoms get better with oral steroids but then quickly returned after the medication has been stopped. He is using triamcinolone cream but is not using Benadryl at home for itching. Discussed with patient that I do not feel putting him on a third course of oral  steroids would be the best option at this point as steroids long-term could be dangerous to him. I have recommended he follow-up with a dermatologist for further evaluation of why he is getting hives. I've given him a list of dermatologists to follow-up with in [place] where he is from. Have recommended using Benadryl for itching. Have recommend that he continue his triamcinolone cream if he feels is helping his symptoms. Discussed with him at length that this needs outpatient follow-up, management. Discussed return precautions. He is comfortable with this plan.   At this time, I do not feel there is any life-threatening condition present. I have reviewed and discussed all results (EKG, imaging, lab, urine as appropriate), exam findings with patient/family. I have reviewed nursing notes and appropriate previous records.  I feel the patient is safe to be discharged home without further emergent workup and can continue workup as an outpatient as needed. Discussed usual and customary return precautions. Patient/family verbalize understanding and are  comfortable with this plan.  Outpatient follow-up has been provided. All questions have been answered.   I personally performed the services described in this documentation, which was scribed in my presence. The recorded information has been reviewed and is accurate.     Layla Maw Annisa Mazzarella, DO 04/04/16 [phone]

## 2016-04-04 NOTE — ED Triage Notes (Signed)
Pt reports recurrent hives and itching that he had a few weeks ago, states they returned yesterday, using rx cream he got last time he was here without relief.

## 2016-04-04 NOTE — Discharge Instructions (Signed)
You need to follow-up with a dermatologist for further evaluation for your chronic hives. You may take Benadryl 50 mg every 6 hours as needed for itching. You may continue your triamcinolone cream. I do not feel like putting you on a third round of steroids is appropriate. Repeated rounds of steroids can be dangerous to you. You need further outpatient management to determine what is causing your symptoms and how to treat them long-term.   [facility] Dermatologists:   [facility]  [address] [place] [address]  [phone]   Dr. Mertha Finders, MD  [address]  [phone]  [facility]  [facility]  [address] [place]  [phone]   [facility]  [address]  [phone]  Janalyn Harder MD  [address]  [phone]  Hoyle Sauer  [address] [place]  [phone]  [place] Amy Y MD  [address] [place]  [phone]  [facility] & [facility]  [address]  [phone]

## 2016-05-09 ENCOUNTER — Encounter: Payer: Self-pay | Admitting: Cardiology

## 2016-05-09 ENCOUNTER — Ambulatory Visit: Payer: Medicaid Other | Admitting: Cardiology

## 2017-01-31 ENCOUNTER — Emergency Department (HOSPITAL_COMMUNITY): Payer: Medicaid Other

## 2017-01-31 ENCOUNTER — Encounter (HOSPITAL_COMMUNITY): Payer: Self-pay

## 2017-01-31 ENCOUNTER — Emergency Department (HOSPITAL_COMMUNITY)
Admission: EM | Admit: 2017-01-31 | Discharge: 2017-01-31 | Disposition: A | Discharge status: Home / Self Care | Payer: Medicaid Other | Attending: Emergency Medicine | Admitting: Emergency Medicine

## 2017-01-31 DIAGNOSIS — R05 Cough: Secondary | ICD-10-CM | POA: Insufficient documentation

## 2017-01-31 DIAGNOSIS — J209 Acute bronchitis, unspecified: Secondary | ICD-10-CM | POA: Diagnosis not present

## 2017-01-31 DIAGNOSIS — F17210 Nicotine dependence, cigarettes, uncomplicated: Secondary | ICD-10-CM | POA: Diagnosis not present

## 2017-01-31 DIAGNOSIS — I10 Essential (primary) hypertension: Secondary | ICD-10-CM | POA: Insufficient documentation

## 2017-01-31 DIAGNOSIS — Z79899 Other long term (current) drug therapy: Secondary | ICD-10-CM | POA: Diagnosis not present

## 2017-01-31 DIAGNOSIS — R070 Pain in throat: Secondary | ICD-10-CM | POA: Diagnosis present

## 2017-01-31 DIAGNOSIS — J40 Bronchitis, not specified as acute or chronic: Secondary | ICD-10-CM

## 2017-01-31 DIAGNOSIS — I159 Secondary hypertension, unspecified: Secondary | ICD-10-CM

## 2017-01-31 LAB — RAPID STREP SCREEN (MED CTR MEBANE ONLY): Streptococcus, Group A Screen (Direct): NEGATIVE

## 2017-01-31 MED ORDER — RAMIPRIL 5 MG PO CAPS: 5.0000 mg | ORAL_CAPSULE | Freq: Every day | ORAL | 0 refills | Status: DC

## 2017-01-31 MED ORDER — AMLODIPINE BESYLATE 5 MG PO TABS: 5.0000 mg | ORAL_TABLET | Freq: Every day | ORAL | 0 refills | Status: DC

## 2017-01-31 MED ORDER — ALBUTEROL SULFATE HFA 108 (90 BASE) MCG/ACT IN AERS
2.0000 | INHALATION_SPRAY | RESPIRATORY_TRACT | Status: DC
  Administered 2017-01-31: 2 via RESPIRATORY_TRACT
  Filled 2017-01-31: qty 6.7

## 2017-01-31 MED ORDER — BENZONATATE 100 MG PO CAPS: 100.0000 mg | ORAL_CAPSULE | Freq: Three times a day (TID) | ORAL | 0 refills | Status: DC

## 2017-01-31 NOTE — ED Notes (Signed)
Pt reports a 3 day history of cough and sore throat with yellow expectorant- he reports smoking

## 2017-01-31 NOTE — ED Triage Notes (Addendum)
Productive cough, sore throat and intermittent headache since Thursday. Denies fever.   Patient has been out of blood pressure medication x3 months. Has apt. 03/17/2017.

## 2017-01-31 NOTE — ED Notes (Signed)
To radiology

## 2017-01-31 NOTE — Discharge Instructions (Signed)
Using an inhaler, 2 puffs every 4 hours for cough and shortness of breath. Take Tessalon as prescribed as needed for cough. Salt water gargles for sore throat several times a day. Drink plenty of fluids to stay hydrated. Make sure to take your blood pressure medications, your blood pressure was elevated today. Follow-up with your family doctor next week for recheck. Return if worsening.

## 2017-01-31 NOTE — ED Provider Notes (Signed)
[facility] Provider Note   CSN: [id number] Arrival date & time: 01/31/17  2001     History   Chief Complaint Chief Complaint  Patient presents with  . Sore Throat  . Cough    HPI Christopher Austin is a 61 y.o. male.  HPI Christopher Austin is a 61 y.o. male presents to emergency department complaining of cough and sore throat. He states that his symptoms began yesterday. He denies any fever or chills. He denies any chest pain or shortness of breath. He denies any headache or neck pain or stiffness. He states he is coughing up clear sputum. He took Alkazelters yesterday, states it helped. When he continued to have symptoms today he decided to come to get checked out. He states nothing is making his symptoms better or worse. No recent ill contacts. Denies diabetes or otherwise being immunocompromised.  Past Medical History:  Diagnosis Date  . Bradycardia    No clear evidence of chronotropic incompetence  . 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 Chronic pain   . Essential hypertension, benign   . Headache(784.0)   . History of alcohol abuse   . History of seizures   . PSVT (paroxysmal supraventricular tachycardia) (HCC)    Noted during exercise testing    Patient Active Problem List   Diagnosis Date Noted  . Chest pain 02/18/2015  . Tobacco abuse 03/20/2014  . PSVT (paroxysmal supraventricular tachycardia) (HCC) 06/11/2012  . Essential hypertension, benign 05/20/2012  . Bradycardia 05/20/2012  . Cardiomyopathy (HCC) 05/20/2012    Past Surgical History:  Procedure Laterality Date  . COLONOSCOPY  05/19/2012   Procedure: COLONOSCOPY;  Surgeon: Corbin Ade, MD;  Location: [facility];  Service: Endoscopy;  Laterality: N/A;  9:30 AM  . Right hand surgery  2011   Abscess drainage       Home Medications    Prior to Admission medications   Medication Sig Start Date End Date Taking? Authorizing Provider  amLODipine (NORVASC) 5 MG tablet Take 1  tablet (5 mg total) by mouth daily. 11/01/14   Jonelle Sidle, MD  diphenhydrAMINE (BENADRYL) 25 mg capsule Take 1-2 capsules (25-50 mg total) by mouth every 6 (six) hours as needed. 04/04/16   Ward, Layla Maw, DO  famotidine (PEPCID) 20 MG tablet Take 1 tablet (20 mg total) by mouth 2 (two) times daily. 02/14/16   [name], PA-C  folic acid (FOLVITE) 1 MG tablet Take 1 mg by mouth daily.    [provider]  ramipril (ALTACE) 5 MG capsule TAKE ONE CAPSULE BY MOUTH ONCE DAILY 11/05/15   Jonelle Sidle, MD  sildenafil (REVATIO) 20 MG tablet Take 20 mg by mouth daily.    [provider]  Triamcinolone Acetonide (TRIAMCINOLONE 0.1 % CREAM : EUCERIN) CREA Apply 1 application topically 2 (two) times daily. 03/09/16   Ivery Quale, PA-C    Family History Family History  Problem Relation Age of Onset  . Hypertension Mother   . Hyperlipidemia Mother     Social History Social History  Substance Use Topics  . Smoking status: Current Every Day Smoker    Packs/day: 1.00    Years: 22.00    Types: Cigarettes    Start date: 07/28/1972  . Smokeless tobacco: Never Used  . Alcohol use 6.0 oz/week    10 Cans of beer per week     Allergies   Patient has no known allergies.   Review of Systems Review of Systems  Constitutional: Negative for chills and fever.  HENT: Positive for congestion and sore throat.   Respiratory: Positive for cough. Negative for shortness of breath and wheezing.   Cardiovascular: Negative for chest pain.  Gastrointestinal: Negative for abdominal pain, diarrhea, nausea and vomiting.  Musculoskeletal: Negative for neck pain and neck stiffness.  Neurological: Negative for headaches.  All other systems reviewed and are negative.    Physical Exam Updated Vital Signs BP (!) 171/89 (BP Location: Right Arm)   Pulse 86   Temp 99.6 F (37.6 C) (Oral)   Resp 20   Ht 5\' 4"  (1.626 m)   Wt 46.7 kg (103 lb)   SpO2 97%   BMI 17.68 kg/m   Physical  Exam  Constitutional: He is oriented to person, place, and time. He appears well-developed and well-nourished. No distress.  HENT:  Head: Normocephalic and atraumatic.  Right Ear: External ear normal.  Left Ear: External ear normal.  Mouth/Throat: Oropharynx is clear and moist.  Clear rhinorrhea, pharynx erythemous, uvula midline  Eyes: Conjunctivae are normal.  Neck: Normal range of motion. Neck supple.  No meningeal signs  Cardiovascular: Normal rate, regular rhythm and normal heart sounds.   Pulmonary/Chest: Effort normal. No respiratory distress. He has wheezes. He has no rales.  There is slight end expiratory wheeze bilaterally  Abdominal: Soft. Bowel sounds are normal. He exhibits no distension. There is no tenderness. There is no rebound.  Musculoskeletal: He exhibits no edema or tenderness.  Lymphadenopathy:    He has no cervical adenopathy.  Neurological: He is alert and oriented to person, place, and time.  Skin: Skin is warm and dry. No erythema.  Psychiatric: He has a normal mood and affect.  Nursing note and vitals reviewed.    ED Treatments / Results  Labs (all labs ordered are listed, but only abnormal results are displayed) Labs Reviewed  RAPID STREP SCREEN (NOT AT [facility])  CULTURE, GROUP A STREP [facility])    EKG  EKG Interpretation None       Radiology Dg Chest 2 View  Result Date: 01/31/2017 CLINICAL DATA:  Productive cough. EXAM: CHEST  2 VIEW COMPARISON:  September 15, 2015 FINDINGS: The heart size and mediastinal contours are within normal limits. Both lungs are clear. The visualized skeletal structures are unremarkable. IMPRESSION: No active cardiopulmonary disease. Electronically Signed   By: Gerome Sam III M.D   On: 01/31/2017 20:31    Procedures Procedures (including critical care time)  Medications Ordered in ED Medications - No data to display   Initial Impression / Assessment and Plan / ED Course  I have reviewed the triage vital signs  and the nursing notes.  Pertinent labs & imaging results that were available during my care of the patient were reviewed by me and considered in my medical decision making (see chart for details).     Patient in the emergency department with nasal congestion, sore throat, cough. Exam unremarkable other than nasal congestion and very slight wheeze on the exam. It cleared with coughing. No respiratory distress. His vital signs show a normal oxygen saturation and respiratory rate. Normal heart rate. His blood pressures elevated, states he did not have his blood pressure medications for several days because he ran out. He is afebrile. He is nontoxic appearing. Rapid strep is negative. Chest x-ray negative. Most likely viral upper respiratory infection/bronchitis. Will treat with an inhaler and Tessalon for cough. I will refill his blood pressure medications so he can take him as soon as possible.  Instructed to see his doctor in 2-3 days for recheck. Patient agrees with the plan, was understanding.  Vitals:   01/31/17 2013  BP: (!) 171/89  Pulse: 86  Resp: 20  Temp: 99.6 F (37.6 C)  TempSrc: Oral  SpO2: 97%  Weight: 46.7 kg (103 lb)  Height: 5\' 4"  (1.626 m)     Final Clinical Impressions(s) / ED Diagnoses   Final diagnoses:  Bronchitis  Secondary hypertension    New Prescriptions New Prescriptions   AMLODIPINE (NORVASC) 5 MG TABLET    Take 1 tablet (5 mg total) by mouth daily.   BENZONATATE (TESSALON) 100 MG CAPSULE    Take 1 capsule (100 mg total) by mouth every 8 (eight) hours.   RAMIPRIL (ALTACE) 5 MG CAPSULE    Take 1 capsule (5 mg total) by mouth daily.     Jaynie Crumble, PA-C 01/31/17 2112    Eber Hong, MD 01/31/17 [phone]

## 2017-02-03 LAB — CULTURE, GROUP A STREP (THRC)

## 2017-02-04 ENCOUNTER — Encounter (HOSPITAL_COMMUNITY): Payer: Self-pay | Admitting: Emergency Medicine

## 2017-02-04 ENCOUNTER — Other Ambulatory Visit: Payer: Self-pay

## 2017-02-04 ENCOUNTER — Emergency Department (HOSPITAL_COMMUNITY)
Admission: EM | Admit: 2017-02-04 | Discharge: 2017-02-04 | Disposition: A | Discharge status: Home / Self Care | Payer: Medicaid Other | Attending: Emergency Medicine | Admitting: Emergency Medicine

## 2017-02-04 DIAGNOSIS — R0602 Shortness of breath: Secondary | ICD-10-CM | POA: Diagnosis present

## 2017-02-04 DIAGNOSIS — Z7952 Long term (current) use of systemic steroids: Secondary | ICD-10-CM | POA: Diagnosis not present

## 2017-02-04 DIAGNOSIS — R0600 Dyspnea, unspecified: Secondary | ICD-10-CM

## 2017-02-04 DIAGNOSIS — R062 Wheezing: Secondary | ICD-10-CM | POA: Insufficient documentation

## 2017-02-04 DIAGNOSIS — I10 Essential (primary) hypertension: Secondary | ICD-10-CM | POA: Diagnosis not present

## 2017-02-04 DIAGNOSIS — Z7951 Long term (current) use of inhaled steroids: Secondary | ICD-10-CM | POA: Diagnosis not present

## 2017-02-04 DIAGNOSIS — F17210 Nicotine dependence, cigarettes, uncomplicated: Secondary | ICD-10-CM | POA: Diagnosis not present

## 2017-02-04 DIAGNOSIS — Z79899 Other long term (current) drug therapy: Secondary | ICD-10-CM | POA: Insufficient documentation

## 2017-02-04 MED ORDER — ALBUTEROL SULFATE HFA 108 (90 BASE) MCG/ACT IN AERS: 1.0000 | INHALATION_SPRAY | Freq: Four times a day (QID) | RESPIRATORY_TRACT | 2 refills | Status: DC | PRN

## 2017-02-04 MED ORDER — PREDNISONE 10 MG PO TABS: 10.0000 mg | ORAL_TABLET | Freq: Every day | ORAL | 0 refills | Status: DC

## 2017-02-04 MED ORDER — IPRATROPIUM-ALBUTEROL 0.5-2.5 (3) MG/3ML IN SOLN
3.0000 mL | Freq: Once | RESPIRATORY_TRACT | Status: AC
  Administered 2017-02-04: 3 mL via RESPIRATORY_TRACT
  Filled 2017-02-04: qty 3

## 2017-02-04 MED ORDER — ALBUTEROL SULFATE (2.5 MG/3ML) 0.083% IN NEBU
2.5000 mg | INHALATION_SOLUTION | Freq: Once | RESPIRATORY_TRACT | Status: AC
  Administered 2017-02-04: 2.5 mg via RESPIRATORY_TRACT
  Filled 2017-02-04: qty 3

## 2017-02-04 MED ORDER — ALBUTEROL SULFATE HFA 108 (90 BASE) MCG/ACT IN AERS
1.0000 | INHALATION_SPRAY | RESPIRATORY_TRACT | Status: DC
  Administered 2017-02-04: 2 via RESPIRATORY_TRACT
  Filled 2017-02-04: qty 6.7

## 2017-02-04 MED ORDER — PREDNISONE 20 MG PO TABS
20.0000 mg | ORAL_TABLET | Freq: Once | ORAL | Status: AC
  Administered 2017-02-04: 20 mg via ORAL
  Filled 2017-02-04: qty 1

## 2017-02-04 NOTE — ED Triage Notes (Signed)
Pt c/o increased sob since mowing the yard today. Pt states he needs a prescription for his inhaler.

## 2017-02-04 NOTE — Discharge Instructions (Signed)
Prescription for inhaler and prednisone.  Follow up your dr.

## 2017-02-04 NOTE — ED Provider Notes (Signed)
[facility] Provider Note   CSN: [id number] Arrival date & time: 02/04/17  2054     History   Chief Complaint Chief Complaint  Patient presents with  . Shortness of Breath    HPI Christopher Austin is a 61 y.o. male.  Patient complains of wheezing and dyspnea after mowing his lawn today. He normally uses an inhaler, but he ran out of it. He just recently quit smoking. No fever, sweats, chills, chest pain, sputum. Severity of symptoms is mild to moderate. He is requesting a prescription for an inhaler.      Past Medical History:  Diagnosis Date  . Bradycardia    No clear evidence of chronotropic incompetence  . 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 Chronic pain   . Essential hypertension, benign   . Headache(784.0)   . History of alcohol abuse   . History of seizures   . PSVT (paroxysmal supraventricular tachycardia) (HCC)    Noted during exercise testing    Patient Active Problem List   Diagnosis Date Noted  . Chest pain 02/18/2015  . Tobacco abuse 03/20/2014  . PSVT (paroxysmal supraventricular tachycardia) (HCC) 06/11/2012  . Essential hypertension, benign 05/20/2012  . Bradycardia 05/20/2012  . Cardiomyopathy (HCC) 05/20/2012    Past Surgical History:  Procedure Laterality Date  . COLONOSCOPY  05/19/2012   Procedure: COLONOSCOPY;  Surgeon: Corbin Ade, MD;  Location: [facility];  Service: Endoscopy;  Laterality: N/A;  9:30 AM  . Right hand surgery  2011   Abscess drainage       Home Medications    Prior to Admission medications   Medication Sig Start Date End Date Taking? Authorizing Provider  albuterol (PROVENTIL HFA;VENTOLIN HFA) 108 (90 Base) MCG/ACT inhaler Inhale 1-2 puffs into the lungs every 6 (six) hours as needed for wheezing or shortness of breath. 02/04/17   Donnetta Hutching, MD  amLODipine (NORVASC) 5 MG tablet Take 1 tablet (5 mg total) by mouth daily. 11/01/14   Jonelle Sidle, MD  amLODipine (NORVASC) 5 MG tablet  Take 1 tablet (5 mg total) by mouth daily. 01/31/17   Kirichenko, Tatyana, PA-C  benzonatate (TESSALON) 100 MG capsule Take 1 capsule (100 mg total) by mouth every 8 (eight) hours. 01/31/17   Kirichenko, Lemont Fillers, PA-C  diphenhydrAMINE (BENADRYL) 25 mg capsule Take 1-2 capsules (25-50 mg total) by mouth every 6 (six) hours as needed. 04/04/16   Ward, Layla Maw, DO  famotidine (PEPCID) 20 MG tablet Take 1 tablet (20 mg total) by mouth 2 (two) times daily. 02/14/16   [name], PA-C  folic acid (FOLVITE) 1 MG tablet Take 1 mg by mouth daily.    [provider]  predniSONE (DELTASONE) 10 MG tablet Take 1 tablet (10 mg total) by mouth daily with breakfast. Two tabs for 5 days; then one tab for 5 days. 02/04/17   Donnetta Hutching, MD  ramipril (ALTACE) 5 MG capsule TAKE ONE CAPSULE BY MOUTH ONCE DAILY 11/05/15   Jonelle Sidle, MD  ramipril (ALTACE) 5 MG capsule Take 1 capsule (5 mg total) by mouth daily. 01/31/17   Kirichenko, Tatyana, PA-C  sildenafil (REVATIO) 20 MG tablet Take 20 mg by mouth daily.    [provider]  Triamcinolone Acetonide (TRIAMCINOLONE 0.1 % CREAM : EUCERIN) CREA Apply 1 application topically 2 (two) times daily. 03/09/16   Ivery Quale, PA-C    Family History Family History  Problem Relation Age of Onset  . Hypertension Mother   .  Hyperlipidemia Mother     Social History Social History  Substance Use Topics  . Smoking status: Current Every Day Smoker    Packs/day: 1.00    Years: 22.00    Types: Cigarettes    Start date: 07/28/1972  . Smokeless tobacco: Never Used  . Alcohol use 6.0 oz/week    10 Cans of beer per week     Allergies   Patient has no known allergies.   Review of Systems Review of Systems  All other systems reviewed and are negative.    Physical Exam Updated Vital Signs BP 139/83 (BP Location: Right Arm)   Pulse 88   Temp 99.3 F (37.4 C) (Oral)   Resp 16   Ht 5\' 4"  (1.626 m)   Wt 46.7 kg (103 lb)   SpO2 94% Comment:  Simultaneous filing. User may not have seen previous data.  BMI 17.68 kg/m   Physical Exam  Constitutional: He is oriented to person, place, and time. He appears well-developed and well-nourished.  No obvious respiratory distress  HENT:  Head: Normocephalic and atraumatic.  Eyes: Conjunctivae are normal.  Neck: Neck supple.  Cardiovascular: Normal rate and regular rhythm.   Pulmonary/Chest: Effort normal.  Minimal bilateral expiratory wheeze  Abdominal: Soft. Bowel sounds are normal.  Musculoskeletal: Normal range of motion.  Neurological: He is alert and oriented to person, place, and time.  Skin: Skin is warm and dry.  Psychiatric: He has a normal mood and affect. His behavior is normal.  Nursing note and vitals reviewed.    ED Treatments / Results  Labs (all labs ordered are listed, but only abnormal results are displayed) Labs Reviewed - No data to display  EKG  EKG Interpretation None       Radiology No results found.  Procedures Procedures (including critical care time)  Medications Ordered in ED Medications  albuterol (PROVENTIL HFA;VENTOLIN HFA) 108 (90 Base) MCG/ACT inhaler 1-2 puff (2 puffs Inhalation Given 02/04/17 2155)  ipratropium-albuterol (DUONEB) 0.5-2.5 (3) MG/3ML nebulizer solution 3 mL (3 mLs Nebulization Given 02/04/17 2121)  albuterol (PROVENTIL) (2.5 MG/3ML) 0.083% nebulizer solution 2.5 mg (2.5 mg Nebulization Given 02/04/17 2121)  predniSONE (DELTASONE) tablet 20 mg (20 mg Oral Given 02/04/17 2141)     Initial Impression / Assessment and Plan / ED Course  I have reviewed the triage vital signs and the nursing notes.  Pertinent labs & imaging results that were available during my care of the patient were reviewed by me and considered in my medical decision making (see chart for details).     Patient has known COPD and ran out of his inhaler. He feels better after a breathing treatment. Discharge medications albuterol inhaler and prednisone.  Patient is stable at discharge  Final Clinical Impressions(s) / ED Diagnoses   Final diagnoses:  Dyspnea, unspecified type    New Prescriptions New Prescriptions   ALBUTEROL (PROVENTIL HFA;VENTOLIN HFA) 108 (90 BASE) MCG/ACT INHALER    Inhale 1-2 puffs into the lungs every 6 (six) hours as needed for wheezing or shortness of breath.   PREDNISONE (DELTASONE) 10 MG TABLET    Take 1 tablet (10 mg total) by mouth daily with breakfast. Two tabs for 5 days; then one tab for 5 days.     Donnetta Hutching, MD 02/04/17 2234

## 2017-03-18 ENCOUNTER — Encounter: Payer: Self-pay | Admitting: Internal Medicine

## 2017-04-15 ENCOUNTER — Ambulatory Visit (INDEPENDENT_AMBULATORY_CARE_PROVIDER_SITE_OTHER): Payer: Medicaid Other | Admitting: Gastroenterology

## 2017-04-15 ENCOUNTER — Encounter: Payer: Self-pay | Admitting: Gastroenterology

## 2017-04-15 DIAGNOSIS — B182 Chronic viral hepatitis C: Secondary | ICD-10-CM | POA: Diagnosis not present

## 2017-04-15 NOTE — Assessment & Plan Note (Signed)
61 year old male with history of chronic Hepatitis C, previously evaluated by [facility] in 2015 but insurance denied treatment due to low fibrosis score. Good candidate for Hep C treatment. Although he has a history of alcohol abuse in remote past, he drinks a rare beer occasionally. Discussed no ETOH during treatment. No current or past drug use. Appears motivated. Overall healthy. Check updated labs to include genotype, INR, Hep B additional serologies (hep b surface antigen negative on file), HIV, Hep A total antibody, CBC with diff. If evidence of advanced liver disease on elastography, will need EGD. Further recommendations to follow.

## 2017-04-15 NOTE — Progress Notes (Signed)
cc'ed to pcp °

## 2017-04-15 NOTE — Patient Instructions (Signed)
Please have blood work done today. I have also ordered a special ultrasound to be completed in the future. Once this is all reviewed, we can submit for treatment!  Further recommendations to follow.

## 2017-04-15 NOTE — Progress Notes (Signed)
Primary Care Physician:  Avon Gully, MD Primary Gastroenterologist:  Dr. Jena Gauss   Chief Complaint  Patient presents with  . Hepatitis C    HPI:   Christopher Austin is a 61 y.o. male presenting today as a referral at the request of Dr. Felecia Shelling secondary to chronic Hepatitis C. He was seen by [facility] in 2015 for initial evaluation for Hep C treatment. 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 History of remote alcohol abuse. He was denied treatment by insurance in 2015 due to fibrosis score of 0 on fibrosure testing. US abdomen in 2015 with subtle findings suggesting possible cirrhosis.   Outside labs from Aug 2018 with completely normal LFTs. HCV RNA log was 4.11. Hep B surface antigen negative. Needs updated CBC, INR, genotype, further Hep B serologies, and HIV. Needs elastography. Found out about Hep C a few years ago. No prior blood transfusion. No drug use of any kind. States the only thing he has ever done was drink a lot of beer and liquor. Only drinks a beer every now and then. No history of tattoos. History of multiple sexual partners when he was younger.   No abdominal pain. No N/V. Good appetite. No GERD symptoms. No dysphagia. No overt GI bleeding. No constipation or diarrhea. States his weight fluctuates between 105 and 112. Per review of notes, he was 105 in 2015.   Colonoscopy in 2013 and due again in 2023.   Past Medical History:  Diagnosis Date  . Bradycardia    No clear evidence of chronotropic incompetence  . 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 Chronic hepatitis C (HCC)   . Chronic pain   . Essential hypertension, benign   . Headache(784.0)   . History of alcohol abuse   . History of seizures    none since the 1990s  . PSVT (paroxysmal supraventricular tachycardia) (HCC)    Noted during exercise testing    Past Surgical History:  Procedure Laterality Date  . COLONOSCOPY  05/19/2012   Dr. 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 10 year screening  . Right hand surgery  2011   Abscess drainage    Current Outpatient Prescriptions  Medication Sig Dispense Refill  . albuterol (PROVENTIL HFA;VENTOLIN HFA) 108 (90 Base) MCG/ACT inhaler Inhale 1-2 puffs into the lungs every 6 (six) hours as needed for wheezing or shortness of breath. 1 Inhaler 2  . naproxen (NAPROSYN) 500 MG tablet Take 500 mg by mouth 2 (two) times daily with a meal.    . ramipril (ALTACE) 5 MG capsule TAKE ONE CAPSULE BY MOUTH ONCE DAILY 90 capsule 3   No current facility-administered medications for this visit.     Allergies as of 04/15/2017  . (No Known Allergies)    Family History  Problem Relation Age of Onset  . Hypertension Mother   . Hyperlipidemia Mother   . Colon cancer Neg Hx   . Colon polyps Neg Hx     Social History   Social History  . Marital status: Legally Separated    Spouse name: N/A  . Number of children: N/A  . Years of education: N/A   Occupational History  . disability    Social History Main Topics  . Smoking status: Current Every Day Smoker    Packs/day: 1.00    Years: 22.00    Types: Cigarettes    Start date: 07/28/1972  .  Smokeless tobacco: Never Used  . Alcohol use 6.0 oz/week    10 Cans of beer per week     Comment: history of alcohol abuse, now only drinks a beer rarely   . Drug use: No  . Sexual activity: Yes    Partners: Female   Other Topics Concern  . Not on file   Social History Narrative  . No narrative on file    Review of Systems: Gen: see HPI  CV: Denies chest pain, heart palpitations, peripheral edema, syncope.  Resp: Denies shortness of breath at rest or with exertion. Denies wheezing or cough.  GI: see HPI  GU : Denies urinary burning, urinary frequency, urinary hesitancy MS: Denies joint pain, muscle weakness, cramps, or limitation of movement.  Derm: Denies rash, itching, dry skin Psych: Denies depression, anxiety, memory loss,  and confusion Heme: see HPI   Physical Exam: BP 122/71   Pulse (!) 57   Temp (!) 97.4 F (36.3 C) (Oral)   Ht 5\' 4"  (1.626 m)   Wt 105 lb 6.4 oz (47.8 kg)   BMI 18.09 kg/m  General:   Alert and oriented. Pleasant and cooperative. Well-nourished and well-developed.  Head:  Normocephalic and atraumatic. Eyes:  Without icterus, sclera clear and conjunctiva pink.  Ears:  Normal auditory acuity. Nose:  No deformity, discharge,  or lesions. Mouth:  No deformity or lesions, oral mucosa pink.  Lungs:  Clear to auscultation bilaterally. No wheezes, rales, or rhonchi. No distress.  Heart:  S1, S2 present without murmurs appreciated.  Abdomen:  +BS, soft, non-tender and non-distended. No HSM noted. No guarding or rebound. No masses appreciated.  Rectal:  Deferred  Msk:  Symmetrical without gross deformities. Normal posture. Extremities:  Without edema. Neurologic:  Alert and  oriented x4 Psych:  Alert and cooperative. Normal mood and affect.

## 2017-04-20 ENCOUNTER — Ambulatory Visit (HOSPITAL_COMMUNITY): Admission: RE | Admit: 2017-04-20 | Payer: Medicaid Other | Source: Ambulatory Visit

## 2017-04-21 LAB — CBC WITH DIFFERENTIAL/PLATELET
BASOS ABS: 43 {cells}/uL (ref 0–200)
Basophils Relative: 0.6 %
EOS ABS: 295 {cells}/uL (ref 15–500)
EOS PCT: 4.1 %
HEMATOCRIT: 38.1 % — AB (ref 38.5–50.0)
HEMOGLOBIN: 12.7 g/dL — AB (ref 13.2–17.1)
LYMPHS ABS: 2858 {cells}/uL (ref 850–3900)
MCH: 32 pg (ref 27.0–33.0)
MCHC: 33.3 g/dL (ref 32.0–36.0)
MCV: 96 fL (ref 80.0–100.0)
MPV: 10.8 fL (ref 7.5–12.5)
Monocytes Relative: 11.2 %
NEUTROS PCT: 44.4 %
Neutro Abs: 3197 cells/uL (ref 1500–7800)
Platelets: 253 10*3/uL (ref 140–400)
RBC: 3.97 10*6/uL — ABNORMAL LOW (ref 4.20–5.80)
RDW: 12.3 % (ref 11.0–15.0)
Total Lymphocyte: 39.7 %
WBC mixed population: 806 cells/uL (ref 200–950)
WBC: 7.2 10*3/uL (ref 3.8–10.8)

## 2017-04-21 LAB — HIV ANTIBODY (ROUTINE TESTING W REFLEX): HIV: NONREACTIVE

## 2017-04-21 LAB — PROTIME-INR
INR: 1
Prothrombin Time: 10.4 s (ref 9.0–11.5)

## 2017-04-21 LAB — HEPATITIS B SURFACE ANTIBODY,QUALITATIVE: Hep B S Ab: NONREACTIVE

## 2017-04-21 LAB — HEPATITIS A ANTIBODY, TOTAL: Hepatitis A AB,Total: REACTIVE — AB

## 2017-04-21 LAB — HEPATITIS C GENOTYPE

## 2017-04-21 LAB — HEPATITIS B CORE ANTIBODY, TOTAL: HEP B C TOTAL AB: NONREACTIVE

## 2017-04-23 ENCOUNTER — Ambulatory Visit (HOSPITAL_COMMUNITY)
Admission: RE | Admit: 2017-04-23 | Discharge: 2017-04-23 | Disposition: A | Discharge status: Home / Self Care | Payer: Medicaid Other | Source: Ambulatory Visit | Attending: Gastroenterology | Admitting: Gastroenterology

## 2017-04-23 DIAGNOSIS — B182 Chronic viral hepatitis C: Secondary | ICD-10-CM | POA: Diagnosis present

## 2017-04-28 NOTE — Progress Notes (Signed)
His Hgb is 12.7. This appears to be new onset. Need to check anemia panel. He is genotype 1a. Immune to Hep A but will need Hep B vaccination. We can submit for Harvoni X 12 weeks. Elastography score F0/F1.

## 2017-05-13 ENCOUNTER — Other Ambulatory Visit: Payer: Self-pay

## 2017-05-13 ENCOUNTER — Other Ambulatory Visit: Payer: Self-pay | Admitting: Gastroenterology

## 2017-05-13 DIAGNOSIS — D649 Anemia, unspecified: Secondary | ICD-10-CM

## 2017-05-25 LAB — IRON,TIBC AND FERRITIN PANEL
%SAT: 25 % (calc) (ref 15–60)
FERRITIN: 232 ng/mL (ref 20–380)
IRON: 86 ug/dL (ref 50–180)
TIBC: 344 mcg/dL (calc) (ref 250–425)

## 2017-05-28 NOTE — Progress Notes (Signed)
Iron studies without evidence of IDA. What's status of Harvoni?

## 2017-06-24 ENCOUNTER — Telehealth: Payer: Self-pay

## 2017-06-24 NOTE — Telephone Encounter (Signed)
Todd from [employer] called- he said [place] medicaid has denied harvoni. They will only pay for mavyret unless the patient cannot take mavyret.  Routing to AB.

## 2017-06-26 NOTE — Telephone Encounter (Signed)
I have changed his prescribed Hep C treatment course to Mavyret 3 tablets daily WITH FOOD for 8 weeks. He has fibrosis score F0/F1. Child Pugh A.

## 2017-06-29 NOTE — Telephone Encounter (Signed)
Paperwork has been faxed to [employer].  

## 2017-07-14 ENCOUNTER — Telehealth: Payer: Self-pay | Admitting: Internal Medicine

## 2017-07-14 ENCOUNTER — Encounter: Payer: Self-pay | Admitting: Internal Medicine

## 2017-07-14 NOTE — Telephone Encounter (Signed)
Noted, will contact pt when medication arrives.

## 2017-07-14 NOTE — Telephone Encounter (Signed)
[facility] called to say patient's harvoni will be delivered to our office tomorrow 07/15/2017

## 2017-07-14 NOTE — Telephone Encounter (Signed)
Christopher Austin, the pts medication will be here tomorrow, what instructions do you want [place] to give him?

## 2017-07-14 NOTE — Telephone Encounter (Signed)
Since all extenders' schedules are out to mid February I have scheduled patient to see RMR on 08/14/2017 at 1030 am.

## 2017-07-14 NOTE — Telephone Encounter (Signed)
Make sure he does not take anything OTC without calling us first. It does not appear he is on a PPI. Make sure not taking PPI or antacids, etc.   Take Mavyret 3 tablets daily WITH FOOD. Needs appt in 4 weeks so we can check labs (CBC, CMP, HCV RNA).

## 2017-07-15 ENCOUNTER — Telehealth: Payer: Self-pay

## 2017-07-15 ENCOUNTER — Telehealth: Payer: Self-pay | Admitting: Internal Medicine

## 2017-07-15 NOTE — Telephone Encounter (Signed)
Lm for pt to call me. Pts medication is ready for pick up in our office.

## 2017-07-15 NOTE — Telephone Encounter (Signed)
LMOM, waiting on a return call. Pts medication Mavyret has arrived in office.

## 2017-07-15 NOTE — Telephone Encounter (Signed)
Routing to Alicia  

## 2017-07-15 NOTE — Telephone Encounter (Signed)
Ida from Dr Letitia Neri office called to say that patient went up there with a letter from [place]  (AB) about getting his medications. I told Malachi Bonds that the nurse was trying to get in touch with the patient that he has medications here at the office he needs to pick up. The number Malachi Bonds had for patient is [phone]

## 2017-07-16 NOTE — Telephone Encounter (Signed)
Noted  

## 2017-07-23 NOTE — Telephone Encounter (Signed)
Lmom, waiting on a return call.  

## 2017-07-24 ENCOUNTER — Other Ambulatory Visit: Payer: Self-pay

## 2017-07-24 DIAGNOSIS — K7460 Unspecified cirrhosis of liver: Secondary | ICD-10-CM

## 2017-07-24 NOTE — Telephone Encounter (Signed)
Spoke with pt and he is going to pick his medication up today.

## 2017-08-14 ENCOUNTER — Encounter: Payer: Self-pay | Admitting: Internal Medicine

## 2017-08-14 ENCOUNTER — Ambulatory Visit: Payer: Medicaid Other | Admitting: Internal Medicine

## 2017-08-14 VITALS — BP 125/75 | HR 55 | Temp 97.1°F | Ht 64.0 in | Wt 111.0 lb

## 2017-08-14 DIAGNOSIS — B182 Chronic viral hepatitis C: Secondary | ICD-10-CM | POA: Diagnosis not present

## 2017-08-14 DIAGNOSIS — I471 Supraventricular tachycardia: Secondary | ICD-10-CM

## 2017-08-14 DIAGNOSIS — I429 Cardiomyopathy, unspecified: Secondary | ICD-10-CM

## 2017-08-14 NOTE — Patient Instructions (Signed)
Complete treatment of hepatitis C  Will arrange follow-up lab testing to confirm that you are cured of  Hep C  Congratulations on no more alcohol!  Office visit in 6 months  Repeat colonoscopy in 2023.

## 2017-08-14 NOTE — Progress Notes (Signed)
Primary Care Physician:  Avon Gully, MD Primary Gastroenterologist:  Dr. Jena Gauss  Pre-Procedure History & Physical: HPI:  Christopher Austin is a 62 y.o. male here for follow-up of a chronic HCV  Infection genotype 1A. Finishing Mavyret in 1 week. He is Tolerated it very well.  Stases given up alcohol completely. Recent METAVIR F0/F1 whichPy 2013; due for surveillance 2000      Past Medical History:  Diagnosis Date  . Bradycardia    No clear evidence of chronotropic incompetence  . 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 Chronic hepatitis C (HCC)   . Chronic pain   . Essential hypertension, benign   . Headache(784.0)   . History of alcohol abuse   . History of seizures    none since the 1990s  . PSVT (paroxysmal supraventricular tachycardia) (HCC)    Noted during exercise testing    Past Surgical History:  Procedure Laterality Date  . COLONOSCOPY  05/19/2012   Dr. 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 10 year screening  . Right hand surgery  2011   Abscess drainage    Prior to Admission medications   Medication Sig Start Date End Date Taking? Authorizing Provider  albuterol (PROVENTIL HFA;VENTOLIN HFA) 108 (90 Base) MCG/ACT inhaler Inhale 1-2 puffs into the lungs every 6 (six) hours as needed for wheezing or shortness of breath. 02/04/17  Yes Donnetta Hutching, MD  amLODipine (NORVASC) 5 MG tablet Take 5 mg by mouth daily.   Yes [provider]  folic acid (FOLVITE) 1 MG tablet Take 1 mg by mouth daily.   Yes [provider]  Glecaprevir-Pibrentasvir (MAVYRET) 100-40 MG TABS Take by mouth 3 (three) times daily.   Yes [provider]  naproxen (NAPROSYN) 500 MG tablet Take 500 mg by mouth as needed.    Yes [provider]  ramipril (ALTACE) 5 MG capsule TAKE ONE CAPSULE BY MOUTH ONCE DAILY 11/05/15  Yes Jonelle Sidle, MD  sildenafil (REVATIO) 20 MG tablet Take 20 mg by  mouth as needed.   Yes [provider]    Allergies as of 08/14/2017  . (No Known Allergies)    Family History  Problem Relation Age of Onset  . Hypertension Mother   . Hyperlipidemia Mother   . Colon cancer Neg Hx   . Colon polyps Neg Hx     Social History   Socioeconomic History  . Marital status: Legally Separated    Spouse name: Not on file  . Number of children: Not on file  . Years of education: Not on file  . Highest education level: Not on file  Social Needs  . Financial resource strain: Not on file  . Food insecurity - worry: Not on file  . Food insecurity - inability: Not on file  . Transportation needs - medical: Not on file  . Transportation needs - non-medical: Not on file  Occupational History  . Occupation: disability  Tobacco Use  . Smoking status: Current Every Day Smoker    Packs/day: 1.00    Years: 22.00    Pack years: 22.00    Types: Cigarettes    Start date: 07/28/1972  . Smokeless tobacco: Never Used  Substance and Sexual Activity  . Alcohol use: No    Alcohol/week: 6.0 oz    Types: 10 Cans of beer per week    Frequency: Never    Comment: history of alcohol abuse-states he  quit drinking 08/14/17  . Drug use: No  . Sexual activity: Yes    Partners: Female  Other Topics Concern  . Not on file  Social History Narrative  . Not on file    Review of Systems: See HPI, otherwise negative ROS  Physical Exam: BP 125/75   Pulse (!) 55   Temp (!) 97.1 F (36.2 C) (Oral)   Ht 5\' 4"  (1.626 m)   Wt 111 lb (50.3 kg)   BMI 19.05 kg/m  General:   Alert,  Well-developed, well-nourished, pleasant and cooperative in NAD Lungs:  Clear throughout to auscultation.   No wheezes, crackles, or rhonchi. No acute distress. Heart:  Regular rate and rhythm; no murmurs, clicks, rubs,  or gallops. Abdomen: Non-distended, normal bowel sounds.  Soft and nontender without appreciable mass or hepatosplenomegaly.  Pulses:  Normal pulses noted. Extremities:   Without clubbing or edema.  Impression: Genotype 1A HCV completing Mavyret therapy.  He looks good. No significant fibrosis on METAVIR.   He was commended on alcohol cessaation. Complete treatment of hepatitis C soon.  Will need 3 month post-treatment labs.  Surveillance colonoscopy 2023.   Recommendations:  Will arrange follow-up lab testing to confirm cure of Hepatitis C..  Congratulations on no more alcohol!  Office visit in 6 months  Repeat colonoscopy in 2023.     Notice: This dictation was prepared with Dragon dictation along with smaller phrase technology. Any transcriptional errors that result from this process are unintentional and may not be corrected upon review.

## 2017-09-02 ENCOUNTER — Telehealth: Payer: Self-pay

## 2017-09-02 ENCOUNTER — Other Ambulatory Visit: Payer: Self-pay

## 2017-09-02 DIAGNOSIS — B182 Chronic viral hepatitis C: Secondary | ICD-10-CM

## 2017-09-02 NOTE — Telephone Encounter (Signed)
I just spoke to pt and he said he completed the Mavyret on 08/21/2017. I told him I will let Lewie Loron, NP know and we will send his order to recheck in 3 months.

## 2017-09-02 NOTE — Telephone Encounter (Signed)
Lab orders on file for 11/30/2017.

## 2017-09-02 NOTE — Telephone Encounter (Signed)
Please check Hep C RNA, CBC, CMP, INR in 3 months. He will be returning in 6 months.

## 2017-09-15 ENCOUNTER — Telehealth: Payer: Self-pay

## 2017-09-15 NOTE — Telephone Encounter (Signed)
Routing to Alicia  

## 2017-09-15 NOTE — Telephone Encounter (Signed)
Received a call from [facility], they tried to reach pt for a few weeks in reference to refilling his Hep medications. Pts number was disconnected. [facility] was able to reach the pts mother today and pt has been off medication for 1 week. Does pt need to restart therapy? Also since [facility] wasn't able to reach pt, his Prior Auth for Hep Medication has expired. Please advise if pt should continue therapy or start therapy over.

## 2017-09-15 NOTE — Telephone Encounter (Signed)
UPDATE:  Still check Hep C RNA, CMP, INR, CBC now. DO NOT START THE SECOND BOTTLE (does he have this?) Tell [facility] NOT to send second bottle.  We need to verify how much he has actually taken. He was supposed to take 8 weeks. From what I can gather, he only took 4 weeks, and there was a lapse of probably close to a month without medication. Difficult to tell.   If HCV RNA is negative, I will continue checking and repeat at 12 weeks. IF IT IS POSITIVE: he will need to be treated with Epclusa, but we will have to ensure that he comes into clinic for visits and demonstrates compliance and understanding of treatment. It is concerning that [facility] was not able to reach him for a few weeks for whatever reason. The reason we would not continue Mavyret right now is concern for resistance.

## 2017-09-15 NOTE — Telephone Encounter (Signed)
We need to check Hep C RNA, CMP, INR, CBC now. He needs to resume therapy as soon as possible so we can complete the course. I will review this with the liver clinic, as I anticipate we need to treat him an additional 8 weeks, almost as if "starting over" (for a total of 12 cumulative instead of 8). Not sure yet, but regardless needs to start this next bottle asap.

## 2017-09-16 ENCOUNTER — Other Ambulatory Visit: Payer: Self-pay

## 2017-09-16 NOTE — Telephone Encounter (Signed)
I called home and mobile, only able to reach on mobile. He was not there. I asked his family member to have him call us as soon as possible.

## 2017-09-16 NOTE — Telephone Encounter (Signed)
Yes. He needs lab work now. From the timeline, he has missed almost a month. Would not want to resume treating with Mavyret due to concerns for developing resistance. I reviewed this with [facility] in [place] (Annamarie Major, NP). Hopefully, his RNA will be negative and we will continue following this. If it is positive, he will need to have treatment with a different agent.

## 2017-09-16 NOTE — Telephone Encounter (Signed)
AB pt started medication when he picked it up around 07/15/17 and took the full bottle. Pt couldn't remember his stop date but feels when he came into our office he was just about finish.pt only received 1 shipment, which he picked up at our office 07/15/18. Pt know he has to get lab work 3 months after his treatment and said our office contacted him about the lab work.

## 2017-09-16 NOTE — Telephone Encounter (Signed)
AB will contact pt to discuss medication first.

## 2017-09-16 NOTE — Progress Notes (Signed)
Opened in error

## 2017-09-17 NOTE — Telephone Encounter (Signed)
Tried calling the number given by pt. No answer, called other numbers on pts list and pts niece was going to have pt call back.

## 2017-09-17 NOTE — Telephone Encounter (Signed)
This is the number pt wants to be reached at. [phone]

## 2017-09-18 NOTE — Telephone Encounter (Signed)
Pt notified and will have lab work drawn.

## 2017-09-24 NOTE — Telephone Encounter (Signed)
Todd at [employer] [facility] called asking if pt was continuing the [employer]. I advised him of AB recommendations below. He said they are going to cancel his order for Mavyret.

## 2017-10-09 ENCOUNTER — Telehealth: Payer: Self-pay

## 2017-10-09 NOTE — Telephone Encounter (Signed)
Pt walked in office today with the empty bottle of Mavyret medication. Pt said "I'm here to pick up more medication".   Pt was asked if he had his lab work complete as asked, per the last phone conversation. Pt hasn't had lab work done. Lab orders were printed for pt and pt is suppose to have lab work done per ab before a treatment plan will be discussed.   Pt advised to have labs done and wait for a call from our office in reference to his next treatment plan.

## 2017-10-30 ENCOUNTER — Other Ambulatory Visit: Payer: Self-pay

## 2017-10-30 DIAGNOSIS — B182 Chronic viral hepatitis C: Secondary | ICD-10-CM

## 2018-01-01 ENCOUNTER — Encounter: Payer: Self-pay | Admitting: Internal Medicine

## 2018-02-01 ENCOUNTER — Telehealth: Payer: Self-pay | Admitting: Gastroenterology

## 2018-02-01 NOTE — Telephone Encounter (Signed)
Lm with a family member. They will have pt call back.

## 2018-02-01 NOTE — Telephone Encounter (Signed)
Patient needs to complete labs as previously requested. There has been a lapse in treatment for Hep C. Phone notes documented earlier this year. I am just following up to ensure we have reached out to him again. We need to send a letter as well that this is important.

## 2018-02-04 NOTE — Telephone Encounter (Signed)
Tried calling pt several times. Tried number pt gave previously as well [phone]. Number is currently out of service. Letter mailed to pt.

## 2018-03-14 ENCOUNTER — Emergency Department (HOSPITAL_COMMUNITY)
Admission: EM | Admit: 2018-03-14 | Discharge: 2018-03-15 | Disposition: A | Discharge status: Home / Self Care | Payer: Medicaid Other | Attending: Emergency Medicine | Admitting: Emergency Medicine

## 2018-03-14 ENCOUNTER — Other Ambulatory Visit: Payer: Self-pay

## 2018-03-14 ENCOUNTER — Encounter (HOSPITAL_COMMUNITY): Payer: Self-pay | Admitting: Emergency Medicine

## 2018-03-14 DIAGNOSIS — Z79899 Other long term (current) drug therapy: Secondary | ICD-10-CM | POA: Insufficient documentation

## 2018-03-14 DIAGNOSIS — I10 Essential (primary) hypertension: Secondary | ICD-10-CM | POA: Insufficient documentation

## 2018-03-14 DIAGNOSIS — F17210 Nicotine dependence, cigarettes, uncomplicated: Secondary | ICD-10-CM | POA: Diagnosis not present

## 2018-03-14 DIAGNOSIS — R42 Dizziness and giddiness: Secondary | ICD-10-CM | POA: Diagnosis not present

## 2018-03-14 DIAGNOSIS — E860 Dehydration: Secondary | ICD-10-CM | POA: Diagnosis not present

## 2018-03-14 LAB — CBC WITH DIFFERENTIAL/PLATELET
Basophils Absolute: 0 10*3/uL (ref 0.0–0.1)
Basophils Relative: 0 %
Eosinophils Absolute: 0.2 10*3/uL (ref 0.0–0.7)
Eosinophils Relative: 2 %
HEMATOCRIT: 41.9 % (ref 39.0–52.0)
HEMOGLOBIN: 14.5 g/dL (ref 13.0–17.0)
LYMPHS ABS: 2.9 10*3/uL (ref 0.7–4.0)
Lymphocytes Relative: 40 %
MCH: 34.9 pg — AB (ref 26.0–34.0)
MCHC: 34.6 g/dL (ref 30.0–36.0)
MCV: 101 fL — ABNORMAL HIGH (ref 78.0–100.0)
MONOS PCT: 14 %
Monocytes Absolute: 1 10*3/uL (ref 0.1–1.0)
Neutro Abs: 3.1 10*3/uL (ref 1.7–7.7)
Neutrophils Relative %: 44 %
Platelets: 152 10*3/uL (ref 150–400)
RBC: 4.15 MIL/uL — ABNORMAL LOW (ref 4.22–5.81)
RDW: 12.7 % (ref 11.5–15.5)
WBC: 7.1 10*3/uL (ref 4.0–10.5)

## 2018-03-14 MED ORDER — SODIUM CHLORIDE 0.9 % IV BOLUS
1000.0000 mL | Freq: Once | INTRAVENOUS | Status: AC
  Administered 2018-03-15: 1000 mL via INTRAVENOUS

## 2018-03-14 NOTE — ED Triage Notes (Signed)
Pt c/o dehydration x "a couple of days", pt states he has been sweating a lot

## 2018-03-15 LAB — URINALYSIS, ROUTINE W REFLEX MICROSCOPIC
Bilirubin Urine: NEGATIVE
Glucose, UA: NEGATIVE mg/dL
Hgb urine dipstick: NEGATIVE
Ketones, ur: NEGATIVE mg/dL
Leukocytes, UA: NEGATIVE
NITRITE: NEGATIVE
Protein, ur: NEGATIVE mg/dL
SPECIFIC GRAVITY, URINE: 1.012 (ref 1.005–1.030)
pH: 5 (ref 5.0–8.0)

## 2018-03-15 LAB — CK
Total CK: 260 U/L (ref 49–397)
Total CK: 210 U/L (ref 49–397)

## 2018-03-15 LAB — BASIC METABOLIC PANEL
Anion gap: 11 (ref 5–15)
BUN: 10 mg/dL (ref 8–23)
CALCIUM: 9.5 mg/dL (ref 8.9–10.3)
CO2: 22 mmol/L (ref 22–32)
Chloride: 104 mmol/L (ref 98–111)
Creatinine, Ser: 0.68 mg/dL (ref 0.61–1.24)
GFR calc Af Amer: >60 mL/min (ref >60)
GFR calc non Af Amer: >60 mL/min (ref >60)
GLUCOSE: 79 mg/dL (ref 70–99)
Potassium: 3.6 mmol/L (ref 3.5–5.1)
Sodium: 137 mmol/L (ref 135–145)

## 2018-03-15 LAB — TROPONIN I
TROPONIN I: 0.04 ng/mL — AB (ref <0.03)
Troponin I: 0.03 ng/mL (ref <0.03)
Troponin I: 0.04 ng/mL (ref <0.03)

## 2018-03-15 MED ORDER — SODIUM CHLORIDE 0.9 % IV BOLUS
1000.0000 mL | Freq: Once | INTRAVENOUS | Status: AC
  Administered 2018-03-15: 1000 mL via INTRAVENOUS

## 2018-03-15 NOTE — Telephone Encounter (Signed)
Noted. Will await labs.

## 2018-03-15 NOTE — ED Notes (Signed)
CRITICAL VALUE ALERT  Critical Value:  Troponin 0.03 Date & Time Notied:  03/15/18 Provider Notified: Rancour Orders Received/Actions taken: None yet

## 2018-03-15 NOTE — ED Provider Notes (Signed)
[facility] EMERGENCY DEPARTMENT Provider Note   CSN: [id number] Arrival date & time: 03/14/18  2050     History   Chief Complaint Chief Complaint  Patient presents with  . Dehydration    HPI Christopher Austin is a 62 y.o. male.  Patient states he has been feeling "dehydrated" for a couple days.  States he had a gradual onset diffuse headache with some intermittent lightheadedness and dizziness as well as episodes of sweating that seem to be worse at night.  Patient states he is noticed sweating during the day as well but is not exertional.  There is no chest pain or shortness of breath.  States he does not use his air conditioning but has been using a fan.  He sleeps with a fan but does not have air conditioning.  Denies any chest pain or shortness of breath.  Denies any focal weakness, numbness, tingling.  States he has not been drinking much water but not had any vomiting or diarrhea.  No difficulty breathing or difficulty swallowing.  No visual changes.  Patient with history of hypertension and nonischemic cardiomyopathy as well as hepatitis C. Patient states he recently relapsed on alcohol and has been having 2 or 3 beers a day for the past several months and feels he is a little shaky when he wakes up in the morning if he does not drink.  No history of alcohol withdrawal seizures.  The history is provided by the patient.    Past Medical History:  Diagnosis Date  . Bradycardia    No clear evidence of chronotropic incompetence  . 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 Chronic hepatitis C (HCC)   . Chronic pain   . Essential hypertension, benign   . Headache(784.0)   . History of alcohol abuse   . History of seizures    none since the 1990s  . PSVT (paroxysmal supraventricular tachycardia) (HCC)    Noted during exercise testing    Patient Active Problem List   Diagnosis Date Noted  . Chronic hepatitis C (HCC) 04/15/2017  . Chest pain 02/18/2015  . Tobacco  abuse 03/20/2014  . PSVT (paroxysmal supraventricular tachycardia) (HCC) 06/11/2012  . Essential hypertension, benign 05/20/2012  . Bradycardia 05/20/2012  . Cardiomyopathy (HCC) 05/20/2012    Past Surgical History:  Procedure Laterality Date  . COLONOSCOPY  05/19/2012   Dr. 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 10 year screening  . Right hand surgery  2011   Abscess drainage        Home Medications    Prior to Admission medications   Medication Sig Start Date End Date Taking? Authorizing Provider  albuterol (PROVENTIL HFA;VENTOLIN HFA) 108 (90 Base) MCG/ACT inhaler Inhale 1-2 puffs into the lungs every 6 (six) hours as needed for wheezing or shortness of breath. 02/04/17   Donnetta Hutching, MD  amLODipine (NORVASC) 5 MG tablet Take 5 mg by mouth daily.    [provider]  folic acid (FOLVITE) 1 MG tablet Take 1 mg by mouth daily.    [provider]  Glecaprevir-Pibrentasvir (MAVYRET) 100-40 MG TABS Take by mouth 3 (three) times daily.    [provider]  naproxen (NAPROSYN) 500 MG tablet Take 500 mg by mouth as needed.     [provider]  ramipril (ALTACE) 5 MG capsule TAKE ONE CAPSULE BY MOUTH ONCE DAILY 11/05/15   Jonelle Sidle, MD  sildenafil (REVATIO) 20 MG  tablet Take 20 mg by mouth as needed.    [provider]    Family History Family History  Problem Relation Age of Onset  . Hypertension Mother   . Hyperlipidemia Mother   . Colon cancer Neg Hx   . Colon polyps Neg Hx     Social History Social History   Tobacco Use  . Smoking status: Current Every Day Smoker    Packs/day: 1.00    Years: 22.00    Pack years: 22.00    Types: Cigarettes    Start date: 07/28/1972  . Smokeless tobacco: Never Used  Substance Use Topics  . Alcohol use: Yes    Alcohol/week: 3.0 standard drinks    Types: 3 Cans of beer per week    Frequency: Never    Comment: history of alcohol  abuse-states he quit drinking 08/14/17  . Drug use: No     Allergies   Patient has no known allergies.   Review of Systems Review of Systems  Constitutional: Positive for activity change, diaphoresis and fatigue. Negative for appetite change and fever.  HENT: Negative for congestion, rhinorrhea and trouble swallowing.   Eyes: Negative for visual disturbance.  Respiratory: Negative for cough, chest tightness and shortness of breath.   Cardiovascular: Negative for chest pain and leg swelling.  Gastrointestinal: Negative for abdominal pain, nausea and vomiting.  Genitourinary: Negative for testicular pain and urgency.  Musculoskeletal: Negative for arthralgias and myalgias.  Skin: Negative for rash.  Neurological: Positive for light-headedness and headaches. Negative for dizziness, weakness and numbness.   all other systems are negative except as noted in the HPI and PMH.     Physical Exam Updated Vital Signs BP (!) 144/86   Pulse 64   Temp 98.2 F (36.8 C) (Oral)   Resp 16   Ht 5\' 4"  (1.626 m)   Wt 46.7 kg   SpO2 96%   BMI 17.68 kg/m   Physical Exam  Constitutional: He is oriented to person, place, and time. He appears well-developed and well-nourished. No distress.  HENT:  Head: Normocephalic and atraumatic.  Mouth/Throat: Oropharynx is clear and moist. No oropharyngeal exudate.  Eyes: Pupils are equal, round, and reactive to light. Conjunctivae and EOM are normal.  Neck: Normal range of motion. Neck supple.  No meningismus.  Cardiovascular: Normal rate, normal heart sounds and intact distal pulses.  No murmur heard. Irregular bradycardia  Pulmonary/Chest: Effort normal and breath sounds normal. No respiratory distress.  Abdominal: Soft. There is no tenderness. There is no rebound and no guarding.  Musculoskeletal: Normal range of motion. He exhibits no edema or tenderness.  Neurological: He is alert and oriented to person, place, and time. No cranial nerve deficit.  He exhibits normal muscle tone. Coordination normal.  CN 2-12 intact, no ataxia on finger to nose, no nystagmus, 5/5 strength throughout, no pronator drift, Romberg negative, normal gait.   Skin: Skin is warm.  Psychiatric: He has a normal mood and affect. His behavior is normal.  Nursing note and vitals reviewed.    ED Treatments / Results  Labs (all labs ordered are listed, but only abnormal results are displayed) Labs Reviewed  CBC WITH DIFFERENTIAL/PLATELET - Abnormal; Notable for the following components:      Result Value   RBC 4.15 (*)    MCV 101.0 (*)    MCH 34.9 (*)    All other components within normal limits  TROPONIN I - Abnormal; Notable for the following components:   Troponin I 0.03 (*)  All other components within normal limits  TROPONIN I - Abnormal; Notable for the following components:   Troponin I 0.04 (*)    All other components within normal limits  TROPONIN I - Abnormal; Notable for the following components:   Troponin I 0.04 (*)    All other components within normal limits  BASIC METABOLIC PANEL  CK  URINALYSIS, ROUTINE W REFLEX MICROSCOPIC  CK    EKG EKG Interpretation  Date/Time:  Monday March 15 2018 03:07:11 EDT Ventricular Rate:  54 PR Interval:    QRS Duration: 89 QT Interval:  457 QTC Calculation: 434 R Axis:   86 Text Interpretation:  Sinus rhythm Atrial premature complex Borderline right axis deviation Anteroseptal infarct, old sinus with PACs Confirmed by [name] [phone]) on 03/15/2018 3:10:41 AM   Radiology No results found.  Procedures Procedures (including critical care time)  Medications Ordered in ED Medications  sodium chloride 0.9 % bolus 1,000 mL (1,000 mLs Intravenous New Bag/Given 03/15/18 0000)     Initial Impression / Assessment and Plan / ED Course  I have reviewed the triage vital signs and the nursing notes.  Pertinent labs & imaging results that were available during my care of the patient were  reviewed by me and considered in my medical decision making (see chart for details).    3 days of intermittent dizziness, lightheadedness, poor p.o. intake with sweating concern for dehydration.  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  He sees Dr. Diona Browner last in 2017.  Last stress test was in 2013.  Patient complains of dizziness and lightheadedness with intermittent sweating but no chest pain. EKG unchanged with PACs.  Patient is hydrated.  His creatinine is normal, CK is normal.  Orthostatics are negative.  He has a nonfocal neurological exam.  EKG is nonischemic.  EKG is nonischemic.  Troponin elevation is minimal at 0.03.  This is stable on subsequent checks of 0.04 and 0.04 and patient is asymptomatic.  Doubt ACS.  Patient has had symptoms for several days but no chest pain with flat troponins. Low suspicion for aortic dissection or pulmonary embolism.  D/w patient to increase hydration at home. Followup with PCP and cardiology. Decrease alcohol use. Return with exertional chest pain, shortness of breath, or any other concerns. Final Clinical Impressions(s) / ED Diagnoses   Final diagnoses:  Dehydration  Dizziness    ED Discharge Orders    None       Saben Donigan, Jeannett Senior, MD 03/15/18 2054

## 2018-03-15 NOTE — ED Notes (Signed)
Pt ambulated without difficulty

## 2018-03-15 NOTE — Telephone Encounter (Signed)
Pt walked in office to get lab forms to have his blood work done. Lab orders were given to pt. Pt was also scheduled a f/u appt.

## 2018-03-15 NOTE — Discharge Instructions (Addendum)
Keep yourself hydrated and reduce your alcohol intake.  Follow-up with your primary doctor as well as Dr. Diona Browner of cardiology.  Return to the ED if you develop chest pain especially if it is exertional, shortness of breath, nausea, vomiting or other concerns.

## 2018-05-07 ENCOUNTER — Other Ambulatory Visit: Payer: Self-pay | Admitting: Gastroenterology

## 2018-05-09 ENCOUNTER — Emergency Department (HOSPITAL_COMMUNITY): Payer: Medicaid Other

## 2018-05-09 ENCOUNTER — Encounter (HOSPITAL_COMMUNITY): Payer: Self-pay | Admitting: Emergency Medicine

## 2018-05-09 ENCOUNTER — Emergency Department (HOSPITAL_COMMUNITY)
Admission: EM | Admit: 2018-05-09 | Discharge: 2018-05-09 | Disposition: A | Discharge status: Home / Self Care | Payer: Medicaid Other | Attending: Emergency Medicine | Admitting: Emergency Medicine

## 2018-05-09 DIAGNOSIS — I10 Essential (primary) hypertension: Secondary | ICD-10-CM | POA: Insufficient documentation

## 2018-05-09 DIAGNOSIS — F17210 Nicotine dependence, cigarettes, uncomplicated: Secondary | ICD-10-CM | POA: Insufficient documentation

## 2018-05-09 DIAGNOSIS — Y998 Other external cause status: Secondary | ICD-10-CM | POA: Insufficient documentation

## 2018-05-09 DIAGNOSIS — S82831A Other fracture of upper and lower end of right fibula, initial encounter for closed fracture: Secondary | ICD-10-CM | POA: Insufficient documentation

## 2018-05-09 DIAGNOSIS — Y9389 Activity, other specified: Secondary | ICD-10-CM | POA: Insufficient documentation

## 2018-05-09 DIAGNOSIS — R2241 Localized swelling, mass and lump, right lower limb: Secondary | ICD-10-CM | POA: Diagnosis not present

## 2018-05-09 DIAGNOSIS — Z79899 Other long term (current) drug therapy: Secondary | ICD-10-CM | POA: Diagnosis not present

## 2018-05-09 DIAGNOSIS — W228XXA Striking against or struck by other objects, initial encounter: Secondary | ICD-10-CM | POA: Diagnosis not present

## 2018-05-09 DIAGNOSIS — Y929 Unspecified place or not applicable: Secondary | ICD-10-CM | POA: Insufficient documentation

## 2018-05-09 DIAGNOSIS — S82839A Other fracture of upper and lower end of unspecified fibula, initial encounter for closed fracture: Secondary | ICD-10-CM

## 2018-05-09 DIAGNOSIS — S99921A Unspecified injury of right foot, initial encounter: Secondary | ICD-10-CM | POA: Diagnosis present

## 2018-05-09 MED ORDER — HYDROCODONE-ACETAMINOPHEN 5-325 MG PO TABS
2.0000 | ORAL_TABLET | Freq: Once | ORAL | Status: AC
  Administered 2018-05-09: 2 via ORAL
  Filled 2018-05-09: qty 2

## 2018-05-09 MED ORDER — HYDROCODONE-ACETAMINOPHEN 5-325 MG PO TABS: 1.0000 | ORAL_TABLET | ORAL | 0 refills | Status: DC | PRN

## 2018-05-09 MED ORDER — IBUPROFEN 800 MG PO TABS
800.0000 mg | ORAL_TABLET | Freq: Once | ORAL | Status: AC
  Administered 2018-05-09: 800 mg via ORAL
  Filled 2018-05-09: qty 1

## 2018-05-09 MED ORDER — IBUPROFEN 600 MG PO TABS: 600.0000 mg | ORAL_TABLET | Freq: Four times a day (QID) | ORAL | 0 refills | Status: DC

## 2018-05-09 MED ORDER — ONDANSETRON HCL 4 MG PO TABS
4.0000 mg | ORAL_TABLET | Freq: Once | ORAL | Status: AC
  Administered 2018-05-09: 4 mg via ORAL
  Filled 2018-05-09: qty 1
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
## 2018-05-10 LAB — CBC WITH DIFFERENTIAL/PLATELET
BASOS ABS: 19 {cells}/uL (ref 0–200)
BASOS PCT: 0.3 %
EOS ABS: 102 {cells}/uL (ref 15–500)
Eosinophils Relative: 1.6 %
HEMATOCRIT: 42.3 % (ref 38.5–50.0)
HEMOGLOBIN: 14.6 g/dL (ref 13.2–17.1)
LYMPHS ABS: 3520 {cells}/uL (ref 850–3900)
MCH: 33.3 pg — AB (ref 27.0–33.0)
MCHC: 34.5 g/dL (ref 32.0–36.0)
MCV: 96.4 fL (ref 80.0–100.0)
MONOS PCT: 9 %
MPV: 10.9 fL (ref 7.5–12.5)
NEUTROS ABS: 2182 {cells}/uL (ref 1500–7800)
Neutrophils Relative %: 34.1 %
Platelets: 193 10*3/uL (ref 140–400)
RBC: 4.39 10*6/uL (ref 4.20–5.80)
RDW: 12.3 % (ref 11.0–15.0)
Total Lymphocyte: 55 %
WBC mixed population: 576 cells/uL (ref 200–950)
WBC: 6.4 10*3/uL (ref 3.8–10.8)

## 2018-05-10 LAB — PROTIME-INR
INR: 1
Prothrombin Time: 10.2 s (ref 9.0–11.5)

## 2018-05-10 LAB — COMPREHENSIVE METABOLIC PANEL
AG RATIO: 1.4 (calc) (ref 1.0–2.5)
ALBUMIN MSPROF: 4.3 g/dL (ref 3.6–5.1)
ALKALINE PHOSPHATASE (APISO): 102 U/L (ref 40–115)
ALT: 16 U/L (ref 9–46)
AST: 25 U/L (ref 10–35)
BUN: 7 mg/dL (ref 7–25)
CHLORIDE: 105 mmol/L (ref 98–110)
CO2: 28 mmol/L (ref 20–32)
CREATININE: 0.75 mg/dL (ref 0.70–1.25)
Calcium: 9.6 mg/dL (ref 8.6–10.3)
GLOBULIN: 3 g/dL (ref 1.9–3.7)
GLUCOSE: 105 mg/dL (ref 65–139)
POTASSIUM: 4.1 mmol/L (ref 3.5–5.3)
Sodium: 143 mmol/L (ref 135–146)
Total Bilirubin: 0.6 mg/dL (ref 0.2–1.2)
Total Protein: 7.3 g/dL (ref 6.1–8.1)

## 2018-05-10 LAB — HEPATITIS C RNA QUANTITATIVE
HCV QUANT LOG: NOT DETECTED {Log_IU}/mL
HCV RNA, PCR, QN: <15 IU/mL

## 2018-05-11 ENCOUNTER — Encounter: Payer: Self-pay | Admitting: Internal Medicine

## 2018-05-11 ENCOUNTER — Ambulatory Visit (INDEPENDENT_AMBULATORY_CARE_PROVIDER_SITE_OTHER): Payer: Medicaid Other | Admitting: Internal Medicine

## 2018-05-11 VITALS — BP 143/83 | HR 71 | Temp 97.0°F | Ht 64.0 in | Wt 101.4 lb

## 2018-05-11 DIAGNOSIS — B182 Chronic viral hepatitis C: Secondary | ICD-10-CM
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
## 2018-05-11 NOTE — Progress Notes (Signed)
Labs resulted 10/14. HCV RNA not detected! He has achieved SVR. Will be seeing Dr. Jena Gauss today, 05/11/18.

## 2018-05-11 NOTE — Patient Instructions (Addendum)
You have been cured of hepatiits C.  Congratulations!  Limit you alcohol intake  Avoid risk factors for re-infection with Hepatitis C  Screening colonoscopy 2023  Follow-up here as needed

## 2018-05-12 ENCOUNTER — Ambulatory Visit: Payer: Medicaid Other | Admitting: Orthopedic Surgery

## 2018-05-12 ENCOUNTER — Encounter: Payer: Self-pay | Admitting: Orthopedic Surgery

## 2018-05-12 VITALS — BP 143/88 | HR 69 | Ht 64.0 in | Wt 103.0 lb

## 2018-05-12 DIAGNOSIS — S8264XA Nondisplaced fracture of lateral malleolus of right fibula, initial encounter for closed fracture: Secondary | ICD-10-CM
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
## 2018-06-07 DIAGNOSIS — S8261XA Displaced fracture of lateral malleolus of right fibula, initial encounter for closed fracture: Secondary | ICD-10-CM | POA: Insufficient documentation

## 2018-06-09 ENCOUNTER — Ambulatory Visit: Payer: Medicaid Other | Admitting: Orthopedic Surgery

## 2018-06-11 ENCOUNTER — Ambulatory Visit (INDEPENDENT_AMBULATORY_CARE_PROVIDER_SITE_OTHER): Payer: Medicaid Other

## 2018-06-11 ENCOUNTER — Encounter: Payer: Self-pay | Admitting: Orthopedic Surgery

## 2018-06-11 ENCOUNTER — Ambulatory Visit (INDEPENDENT_AMBULATORY_CARE_PROVIDER_SITE_OTHER): Payer: Medicaid Other | Admitting: Orthopedic Surgery

## 2018-06-11 VITALS — BP 163/76 | HR 54 | Ht 64.0 in | Wt 105.0 lb

## 2018-06-11 DIAGNOSIS — S8264XD Nondisplaced fracture of lateral malleolus of right fibula, subsequent encounter for closed fracture with routine healing: Secondary | ICD-10-CM

## 2018-06-11 NOTE — Progress Notes (Signed)
Follow-up fracture care  Chief Complaint  Patient presents with  . Fracture    Right ankle DOI 05/07/18    Code Description Service Date Service Provider Modifiers Qty [phone] PR CLOSED RX DIST FIBULA FX 05/12/2018 Vickki Hearing, MD 1 [id number] PR OFFICE OUTPATIENT NEW 30 MINUTES 05/12/2018 Vickki Hearing, MD 25, 57 1   Encounter Diagnosis  Name Primary?  . Closed nondisplaced fracture of lateral malleolus of right fibula with routine healing, subsequent encounter 05/07/18 Yes   Post inj day # 35  Mild tenderness at the fracture site  Today's x-ray shows a fracture stable healing without displacement  Follow-up on December 6 for x-ray

## 2018-07-02 ENCOUNTER — Ambulatory Visit (INDEPENDENT_AMBULATORY_CARE_PROVIDER_SITE_OTHER): Payer: Medicaid Other | Admitting: Orthopedic Surgery

## 2018-07-02 ENCOUNTER — Ambulatory Visit (INDEPENDENT_AMBULATORY_CARE_PROVIDER_SITE_OTHER): Payer: Medicaid Other

## 2018-07-02 VITALS — BP 158/97 | HR 70 | Ht 64.0 in | Wt 105.0 lb

## 2018-07-02 DIAGNOSIS — S8264XD Nondisplaced fracture of lateral malleolus of right fibula, subsequent encounter for closed fracture with routine healing: Secondary | ICD-10-CM | POA: Diagnosis not present

## 2018-07-02 NOTE — Progress Notes (Signed)
Patient ID: Christopher Austin, male   DOB: 03-09-1956, 62 y.o.   MRN: [id number]  FRACTURE CARE   Chief Complaint  Patient presents with  . Fracture    Encounter Diagnosis  Name Primary?  . Closed nondisplaced fracture of lateral malleolus of right fibula with routine healing, subsequent encounter 05/07/18 Yes    POST INJURY DAY: 56  GLOBAL PERIOD DAY 51/90  Normal alignment no swelling  Seems to have all tenderness resolved   xrays see report   Fracture healing appropriately without any ankle mortise displacement  Patient is allowed to come out of his cam walker use crutches as needed for couple of weeks  Follow-up as needed

## 2018-07-02 NOTE — Patient Instructions (Signed)
Patient is allowed to come out of his cam walker use crutches as needed for couple of weeks  Follow-up as needed

## 2018-08-02 ENCOUNTER — Encounter (HOSPITAL_COMMUNITY): Payer: Self-pay | Admitting: *Deleted

## 2018-08-02 ENCOUNTER — Emergency Department (HOSPITAL_COMMUNITY): Payer: Medicaid Other

## 2018-08-02 ENCOUNTER — Other Ambulatory Visit: Payer: Self-pay

## 2018-08-02 DIAGNOSIS — Z5321 Procedure and treatment not carried out due to patient leaving prior to being seen by health care provider: Secondary | ICD-10-CM | POA: Insufficient documentation

## 2018-08-02 DIAGNOSIS — F1010 Alcohol abuse, uncomplicated: Secondary | ICD-10-CM | POA: Insufficient documentation

## 2018-08-02 DIAGNOSIS — R05 Cough: Secondary | ICD-10-CM | POA: Diagnosis not present

## 2018-08-02 NOTE — ED Triage Notes (Signed)
Pt c/o cough that is productive with white sputum and URI and also requesting detox from alcohol, last drink was prior to arrival in er,

## 2018-08-03 ENCOUNTER — Emergency Department (HOSPITAL_COMMUNITY)
Admission: EM | Admit: 2018-08-03 | Discharge: 2018-08-03 | Disposition: A | Discharge status: Home / Self Care | Payer: Medicaid Other | Attending: Emergency Medicine | Admitting: Emergency Medicine

## 2018-08-03 NOTE — ED Notes (Signed)
Called x1, no answer

## 2019-01-06 ENCOUNTER — Other Ambulatory Visit: Payer: Self-pay

## 2019-01-06 ENCOUNTER — Emergency Department (HOSPITAL_COMMUNITY)
Admission: EM | Admit: 2019-01-06 | Discharge: 2019-01-06 | Disposition: A | Discharge status: Home / Self Care | Payer: Medicaid Other | Attending: Emergency Medicine | Admitting: Emergency Medicine

## 2019-01-06 ENCOUNTER — Encounter (HOSPITAL_COMMUNITY): Payer: Self-pay | Admitting: Emergency Medicine

## 2019-01-06 DIAGNOSIS — Z79899 Other long term (current) drug therapy: Secondary | ICD-10-CM | POA: Diagnosis not present

## 2019-01-06 DIAGNOSIS — Y908 Blood alcohol level of 240 mg/100 ml or more: Secondary | ICD-10-CM | POA: Diagnosis not present

## 2019-01-06 DIAGNOSIS — F1010 Alcohol abuse, uncomplicated: Secondary | ICD-10-CM | POA: Diagnosis not present

## 2019-01-06 DIAGNOSIS — I10 Essential (primary) hypertension: Secondary | ICD-10-CM | POA: Insufficient documentation

## 2019-01-06 DIAGNOSIS — F17210 Nicotine dependence, cigarettes, uncomplicated: Secondary | ICD-10-CM | POA: Diagnosis not present

## 2019-01-06 DIAGNOSIS — Z0279 Encounter for issue of other medical certificate: Secondary | ICD-10-CM | POA: Diagnosis present

## 2019-01-06 LAB — COMPREHENSIVE METABOLIC PANEL
ALT: 67 U/L — ABNORMAL HIGH (ref 0–44)
AST: 90 U/L — ABNORMAL HIGH (ref 15–41)
Albumin: 3.9 g/dL (ref 3.5–5.0)
Alkaline Phosphatase: 88 U/L (ref 38–126)
Anion gap: 12 (ref 5–15)
BUN: 5 mg/dL — ABNORMAL LOW (ref 8–23)
CO2: 25 mmol/L (ref 22–32)
Calcium: 8.9 mg/dL (ref 8.9–10.3)
Chloride: 104 mmol/L (ref 98–111)
Creatinine, Ser: 0.76 mg/dL (ref 0.61–1.24)
GFR calc Af Amer: >60 mL/min (ref >60)
GFR calc non Af Amer: >60 mL/min (ref >60)
Glucose, Bld: 147 mg/dL — ABNORMAL HIGH (ref 70–99)
Potassium: 3.6 mmol/L (ref 3.5–5.1)
Sodium: 141 mmol/L (ref 135–145)
Total Bilirubin: 0.7 mg/dL (ref 0.3–1.2)
Total Protein: 7.2 g/dL (ref 6.5–8.1)

## 2019-01-06 LAB — CBC
HCT: 41.1 % (ref 39.0–52.0)
Hemoglobin: 13.9 g/dL (ref 13.0–17.0)
MCH: 34.8 pg — ABNORMAL HIGH (ref 26.0–34.0)
MCHC: 33.8 g/dL (ref 30.0–36.0)
MCV: 102.8 fL — ABNORMAL HIGH (ref 80.0–100.0)
Platelets: 155 10*3/uL (ref 150–400)
RBC: 4 MIL/uL — ABNORMAL LOW (ref 4.22–5.81)
RDW: 13.8 % (ref 11.5–15.5)
WBC: 4.7 10*3/uL (ref 4.0–10.5)
nRBC: 0 % (ref 0.0–0.2)

## 2019-01-06 LAB — RAPID URINE DRUG SCREEN, HOSP PERFORMED
Amphetamines: NOT DETECTED
Barbiturates: NOT DETECTED
Benzodiazepines: NOT DETECTED
Cocaine: NOT DETECTED
Opiates: NOT DETECTED
Tetrahydrocannabinol: NOT DETECTED

## 2019-01-06 LAB — ETHANOL: Alcohol, Ethyl (B): 386 mg/dL (ref <10)

## 2019-01-06 NOTE — Discharge Instructions (Signed)
Do not drink any alcohol tonight and follow-up at your alcohol treatment center the first thing in the morning tomorrow

## 2019-01-06 NOTE — ED Provider Notes (Signed)
[facility] EMERGENCY DEPARTMENT Provider Note   CSN: [id number] Arrival date & time: 01/06/19  2124     History   Chief Complaint Chief Complaint  Patient presents with  . Medical Clearance    HPI Christopher Austin is a 63 y.o. male.     Patient came to the emergency department to be medically cleared to go to an alcohol treatment center.  The history is provided by the patient. No language interpreter was used.  Alcohol Problem This is a recurrent problem. The current episode started more than 2 days ago. The problem occurs constantly. The problem has not changed since onset.Pertinent negatives include no chest pain, no abdominal pain and no headaches. Nothing aggravates the symptoms. Nothing relieves the symptoms. He has tried nothing for the symptoms. The treatment provided no relief.    Past Medical History:  Diagnosis Date  . Bradycardia    No clear evidence of chronotropic incompetence  . 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 Chronic hepatitis C (HCC)   . Chronic pain   . Essential hypertension, benign   . Headache(784.0)   . History of alcohol abuse   . History of seizures    none since the 1990s  . PSVT (paroxysmal supraventricular tachycardia) (HCC)    Noted during exercise testing    Patient Active Problem List   Diagnosis Date Noted  . Fracture of right ankle, lateral malleolus 05/07/18 06/07/2018  . Chronic hepatitis C (HCC) 04/15/2017  . Chest pain 02/18/2015  . Tobacco abuse 03/20/2014  . PSVT (paroxysmal supraventricular tachycardia) (HCC) 06/11/2012  . Essential hypertension, benign 05/20/2012  . Bradycardia 05/20/2012  . Cardiomyopathy (HCC) 05/20/2012    Past Surgical History:  Procedure Laterality Date  . COLONOSCOPY  05/19/2012   Dr. 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 10 year screening  . Right hand surgery  2011   Abscess drainage        Home Medications     Prior to Admission medications   Medication Sig Start Date End Date Taking? Authorizing Provider  albuterol (PROVENTIL HFA;VENTOLIN HFA) 108 (90 Base) MCG/ACT inhaler Inhale 1-2 puffs into the lungs every 6 (six) hours as needed for wheezing or shortness of breath. 02/04/17   Donnetta Hutching, MD  amLODipine (NORVASC) 5 MG tablet Take 5 mg by mouth daily.    [provider]  folic acid (FOLVITE) 1 MG tablet Take 1 mg by mouth daily.    [provider]  Glecaprevir-Pibrentasvir (MAVYRET) 100-40 MG TABS Take by mouth 3 (three) times daily.    [provider]  HYDROcodone-acetaminophen (NORCO/VICODIN) 5-325 MG tablet Take 1 tablet by mouth every 4 (four) hours as needed. Patient not taking: Reported on 06/11/2018 05/09/18   Ivery Quale, PA-C  ibuprofen (ADVIL,MOTRIN) 600 MG tablet Take 1 tablet (600 mg total) by mouth 4 (four) times daily. Patient not taking: Reported on 07/02/2018 05/09/18   Ivery Quale, PA-C  naproxen (NAPROSYN) 500 MG tablet Take 500 mg by mouth as needed.     [provider]  ramipril (ALTACE) 5 MG capsule TAKE ONE CAPSULE BY MOUTH ONCE DAILY Patient not taking: Reported on 06/11/2018 11/05/15   Jonelle Sidle, MD  sildenafil (REVATIO) 20 MG tablet Take 20 mg by mouth as needed.    [provider]    Family History Family History  Problem Relation Age of Onset  . Hypertension Mother   . Hyperlipidemia Mother   .  Colon cancer Neg Hx   . Colon polyps Neg Hx     Social History Social History   Tobacco Use  . Smoking status: Current Every Day Smoker    Packs/day: 1.00    Years: 22.00    Pack years: 22.00    Types: Cigarettes    Start date: 07/28/1972  . Smokeless tobacco: Never Used  Substance Use Topics  . Alcohol use: Yes    Alcohol/week: 3.0 standard drinks    Types: 3 Cans of beer per week    Frequency: Never    Comment: daily  . Drug use: No     Allergies   Patient has no known allergies.   Review of  Systems Review of Systems  Constitutional: Negative for appetite change and fatigue.  HENT: Negative for congestion, ear discharge and sinus pressure.   Eyes: Negative for discharge.  Respiratory: Negative for cough.   Cardiovascular: Negative for chest pain.  Gastrointestinal: Negative for abdominal pain and diarrhea.  Genitourinary: Negative for frequency and hematuria.  Musculoskeletal: Negative for back pain.  Skin: Negative for rash.  Neurological: Negative for seizures and headaches.  Psychiatric/Behavioral: Negative for hallucinations.     Physical Exam Updated Vital Signs BP (!) 130/99 (BP Location: Right Arm)   Pulse 79   Temp 98.2 F (36.8 C) (Oral)   Resp 16   Ht 5\' 4"  (1.626 m)   Wt 45.4 kg   SpO2 95%   BMI 17.16 kg/m   Physical Exam Vitals signs and nursing note reviewed.  Constitutional:      Appearance: He is well-developed.  HENT:     Head: Normocephalic.     Nose: Nose normal.  Eyes:     General: No scleral icterus.    Conjunctiva/sclera: Conjunctivae normal.  Neck:     Musculoskeletal: Neck supple.     Thyroid: No thyromegaly.  Cardiovascular:     Rate and Rhythm: Normal rate and regular rhythm.     Heart sounds: No murmur. No friction rub. No gallop.   Pulmonary:     Breath sounds: No stridor. No wheezing or rales.  Chest:     Chest wall: No tenderness.  Abdominal:     General: There is no distension.     Tenderness: There is no abdominal tenderness. There is no rebound.  Musculoskeletal: Normal range of motion.  Lymphadenopathy:     Cervical: No cervical adenopathy.  Skin:    Findings: No erythema or rash.  Neurological:     Mental Status: He is oriented to person, place, and time.     Motor: No abnormal muscle tone.     Coordination: Coordination normal.  Psychiatric:        Behavior: Behavior normal.     Comments: Patient is not suicidal or homicidal      ED Treatments / Results  Labs (all labs ordered are listed, but only  abnormal results are displayed) Labs Reviewed  COMPREHENSIVE METABOLIC PANEL - Abnormal; Notable for the following components:      Result Value   Glucose, Bld 147 (*)    BUN 5 (*)    AST 90 (*)    ALT 67 (*)    All other components within normal limits  CBC - Abnormal; Notable for the following components:   RBC 4.00 (*)    MCV 102.8 (*)    MCH 34.8 (*)    All other components within normal limits  ETHANOL  RAPID URINE DRUG SCREEN, HOSP PERFORMED  EKG    Radiology No results found.  Procedures Procedures (including critical care time)  Medications Ordered in ED Medications - No data to display   Initial Impression / Assessment and Plan / ED Course  I have reviewed the triage vital signs and the nursing notes.  Pertinent labs & imaging results that were available during my care of the patient were reviewed by me and considered in my medical decision making (see chart for details).       Labs are consistent with alcohol abuse.  Patient still is medically cleared to go to an alcohol treatment center.  Final Clinical Impressions(s) / ED Diagnoses   Final diagnoses:  None    ED Discharge Orders    None       Bethann BerkshireZammit, Locklan Canoy, MD 01/06/19 2248

## 2019-01-06 NOTE — ED Triage Notes (Signed)
Pt here for medical clearance to get into [facility] for ETOH detox.

## 2019-01-06 NOTE — ED Notes (Signed)
Pt denies SI or HI, pt admits to drinking beer all day.  Pt wants to detox from ETOH.

## 2019-01-06 NOTE — ED Notes (Signed)
CRITICAL VALUE ALERT  Critical Value: ETOH 386  Date & Time Notied:  2300 01/06/2019  Provider Notified: Dr. Roderic Palau  Orders Received/Actions taken: see chart

## 2019-06-08 IMAGING — DX DG CHEST 2V
2 series · 2 of 2 positions shown · non-contrast
Comparison: September 15, 2015

CLINICAL DATA: Productive cough.

EXAM:
CHEST  2 VIEW

[chest pa]
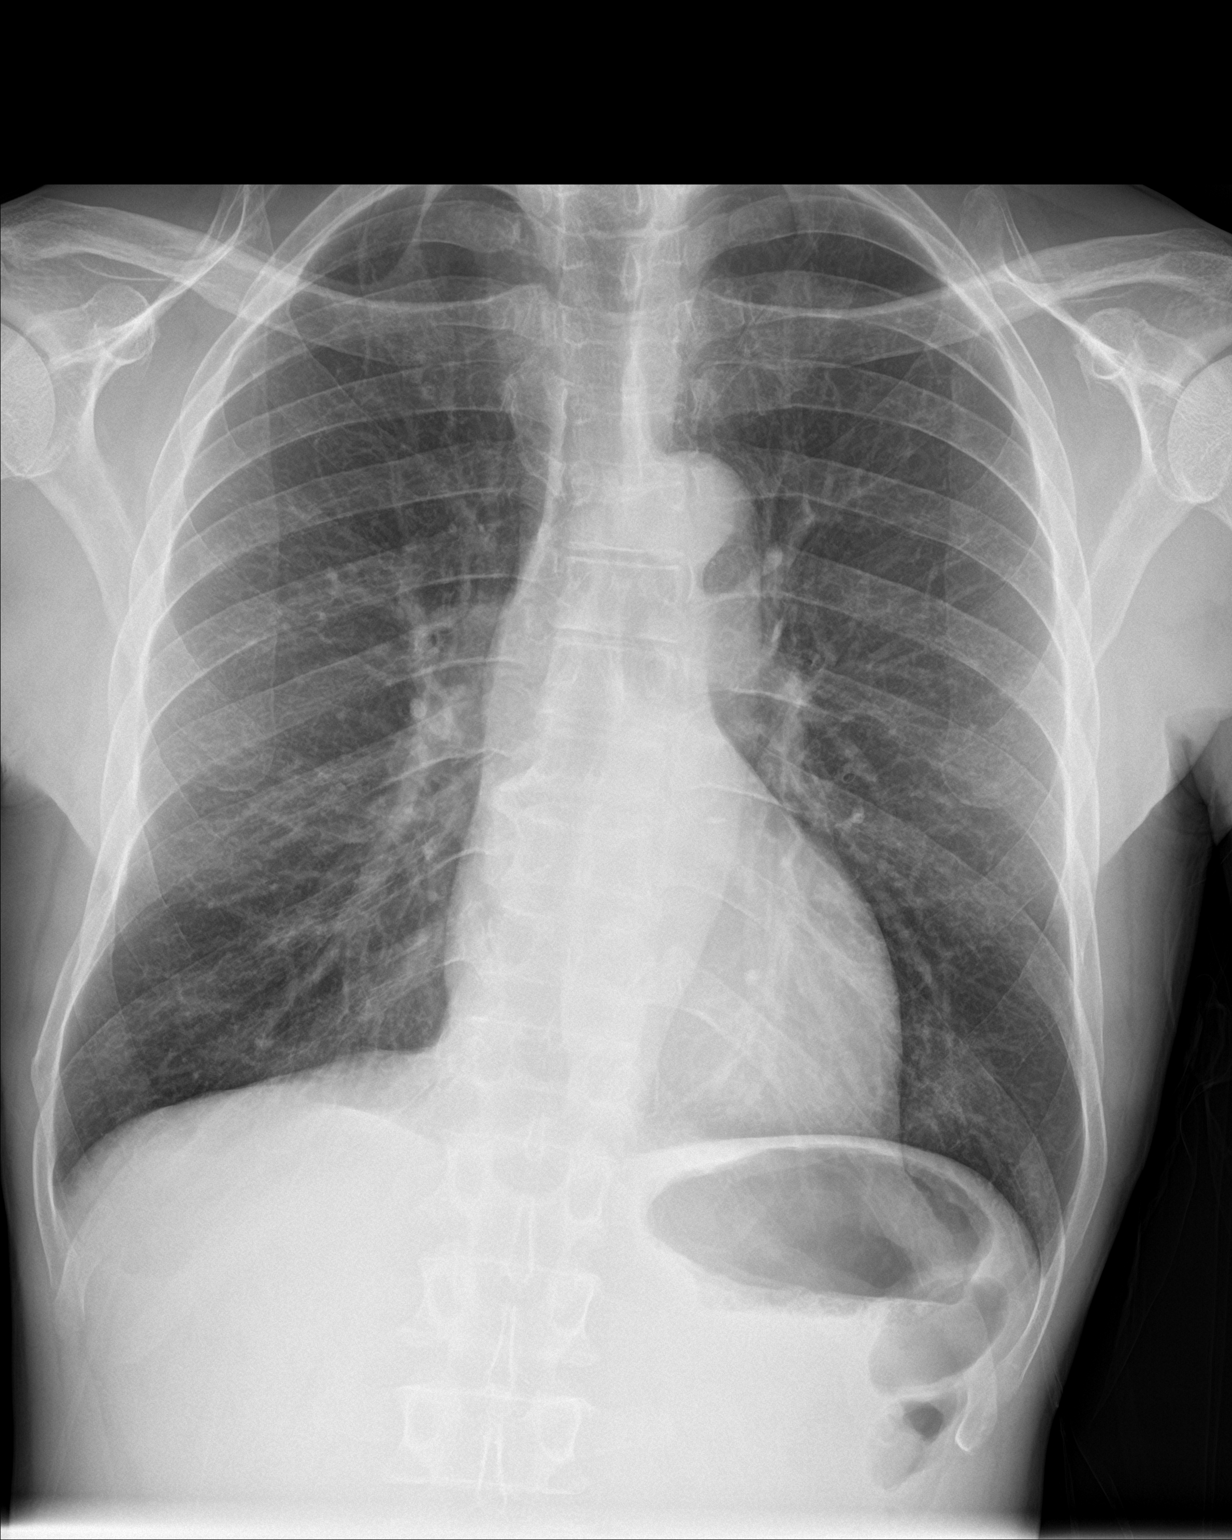

[chest lat]
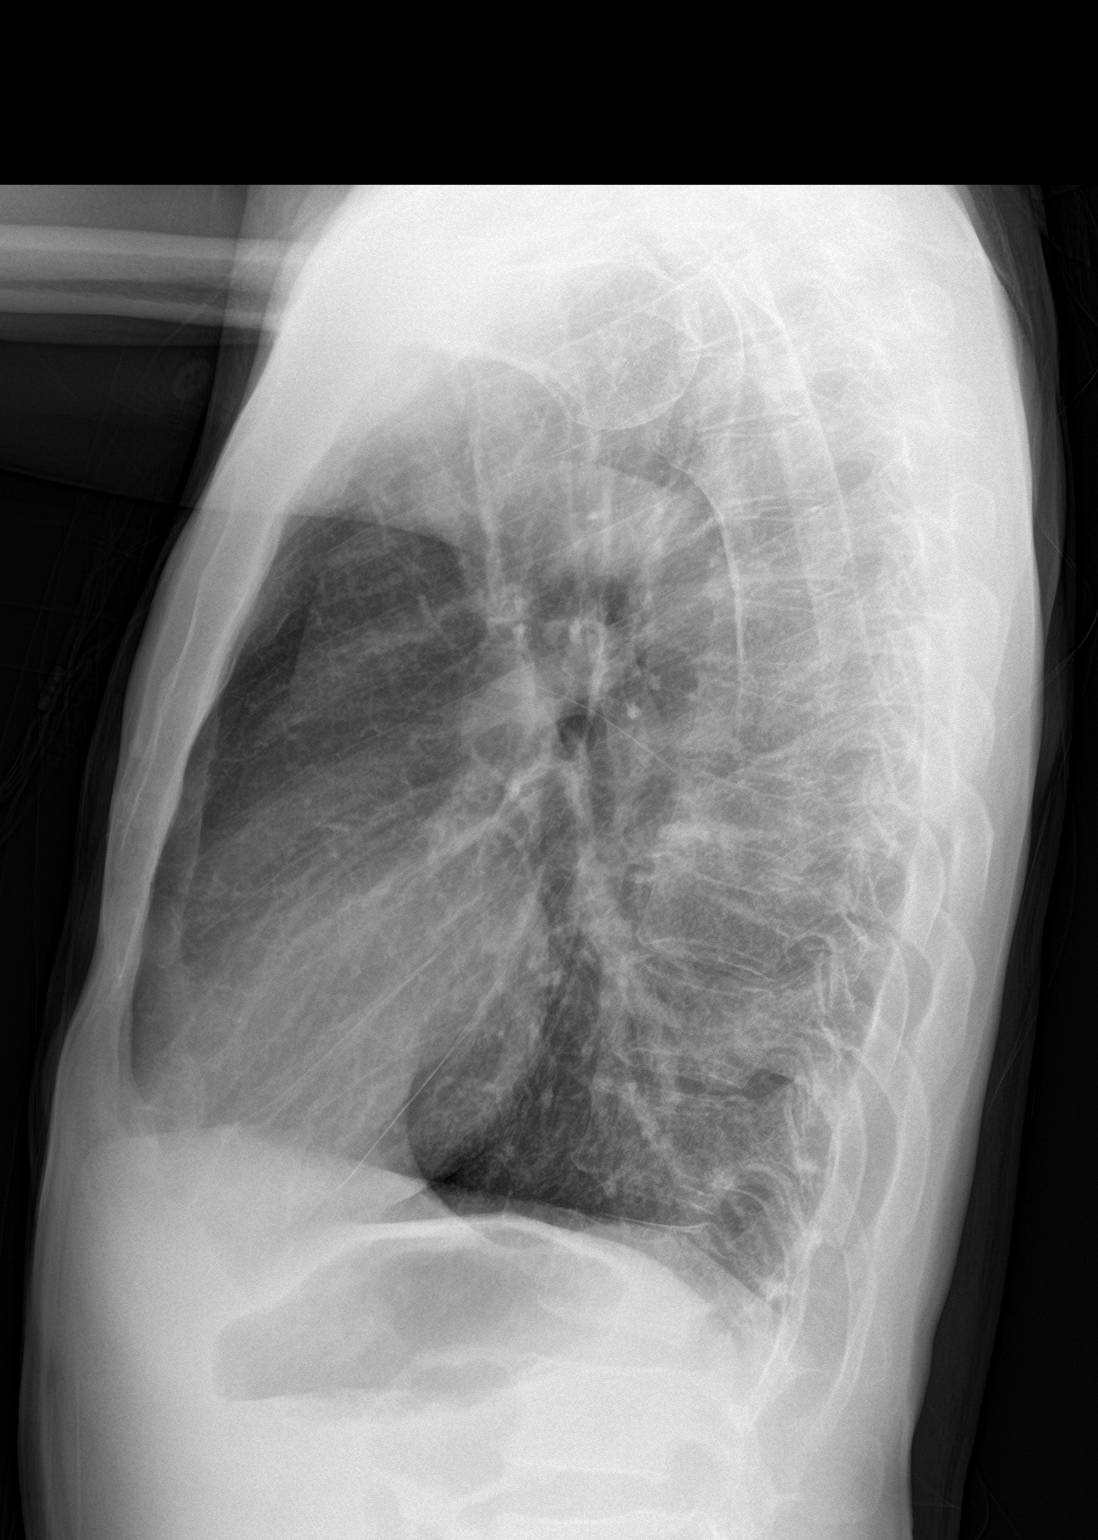

[2 of 2 positions shown; findings below may reference images not displayed]

FINDINGS: The heart size and mediastinal contours are within normal limits.
Both lungs are clear. The visualized skeletal structures are
unremarkable.
IMPRESSION: No active cardiopulmonary disease.

## 2021-08-15 ENCOUNTER — Other Ambulatory Visit (HOSPITAL_COMMUNITY): Payer: Self-pay | Admitting: Gerontology

## 2021-08-15 ENCOUNTER — Other Ambulatory Visit: Payer: Self-pay | Admitting: Gerontology

## 2021-08-15 DIAGNOSIS — Z87891 Personal history of nicotine dependence: Secondary | ICD-10-CM

## 2021-08-20 ENCOUNTER — Encounter: Payer: Self-pay | Admitting: *Deleted

## 2021-09-17 ENCOUNTER — Ambulatory Visit (HOSPITAL_COMMUNITY): Admission: RE | Admit: 2021-09-17 | Payer: Medicare Other | Source: Ambulatory Visit

## 2021-09-17 ENCOUNTER — Encounter (HOSPITAL_COMMUNITY): Payer: Self-pay

## 2021-10-08 ENCOUNTER — Other Ambulatory Visit: Payer: Self-pay

## 2021-10-08 ENCOUNTER — Telehealth: Payer: Self-pay | Admitting: *Deleted

## 2021-10-08 ENCOUNTER — Ambulatory Visit: Payer: Medicaid Other

## 2021-10-08 NOTE — Telephone Encounter (Signed)
Tried to call pt for 3:00 nurse visit.  Had to leave a voice mail for pt to call me back.  ?

## 2022-02-07 ENCOUNTER — Other Ambulatory Visit (HOSPITAL_COMMUNITY)
Admission: RE | Admit: 2022-02-07 | Discharge: 2022-02-07 | Disposition: A | Discharge status: Home / Self Care | Payer: Medicare Other | Source: Ambulatory Visit | Attending: Adult Health | Admitting: Adult Health

## 2022-02-07 DIAGNOSIS — R972 Elevated prostate specific antigen [PSA]: Secondary | ICD-10-CM | POA: Insufficient documentation

## 2022-02-07 DIAGNOSIS — E559 Vitamin D deficiency, unspecified: Secondary | ICD-10-CM | POA: Diagnosis not present

## 2022-02-07 DIAGNOSIS — Z1329 Encounter for screening for other suspected endocrine disorder: Secondary | ICD-10-CM | POA: Insufficient documentation

## 2022-02-07 DIAGNOSIS — I10 Essential (primary) hypertension: Secondary | ICD-10-CM | POA: Insufficient documentation

## 2022-02-07 DIAGNOSIS — Z13220 Encounter for screening for lipoid disorders: Secondary | ICD-10-CM | POA: Insufficient documentation

## 2022-02-07 DIAGNOSIS — Z125 Encounter for screening for malignant neoplasm of prostate: Secondary | ICD-10-CM | POA: Diagnosis not present

## 2022-02-07 DIAGNOSIS — Z79899 Other long term (current) drug therapy: Secondary | ICD-10-CM | POA: Insufficient documentation

## 2022-02-07 DIAGNOSIS — R531 Weakness: Secondary | ICD-10-CM | POA: Insufficient documentation

## 2022-02-07 DIAGNOSIS — Z131 Encounter for screening for diabetes mellitus: Secondary | ICD-10-CM | POA: Insufficient documentation

## 2022-02-07 LAB — URINALYSIS, ROUTINE W REFLEX MICROSCOPIC
Bilirubin Urine: NEGATIVE
Glucose, UA: NEGATIVE mg/dL
Hgb urine dipstick: NEGATIVE
Ketones, ur: NEGATIVE mg/dL
Leukocytes,Ua: NEGATIVE
Nitrite: NEGATIVE
Protein, ur: NEGATIVE mg/dL
Specific Gravity, Urine: 1.01 (ref 1.005–1.030)
pH: 7 (ref 5.0–8.0)

## 2022-02-07 LAB — LIPID PANEL
Cholesterol: 195 mg/dL (ref 0–200)
HDL: 74 mg/dL (ref >40)
LDL Cholesterol: 109 mg/dL — ABNORMAL HIGH (ref 0–99)
Total CHOL/HDL Ratio: 2.6 RATIO
Triglycerides: 61 mg/dL (ref <150)
VLDL: 12 mg/dL (ref 0–40)

## 2022-02-07 LAB — COMPREHENSIVE METABOLIC PANEL
ALT: 52 U/L — ABNORMAL HIGH (ref 0–44)
AST: 73 U/L — ABNORMAL HIGH (ref 15–41)
Albumin: 4.1 g/dL (ref 3.5–5.0)
Alkaline Phosphatase: 122 U/L (ref 38–126)
Anion gap: 9 (ref 5–15)
BUN: 6 mg/dL — ABNORMAL LOW (ref 8–23)
CO2: 26 mmol/L (ref 22–32)
Calcium: 9.4 mg/dL (ref 8.9–10.3)
Chloride: 102 mmol/L (ref 98–111)
Creatinine, Ser: 0.65 mg/dL (ref 0.61–1.24)
GFR, Estimated: >60 mL/min (ref >60)
Glucose, Bld: 102 mg/dL — ABNORMAL HIGH (ref 70–99)
Potassium: 3.5 mmol/L (ref 3.5–5.1)
Sodium: 137 mmol/L (ref 135–145)
Total Bilirubin: 1.5 mg/dL — ABNORMAL HIGH (ref 0.3–1.2)
Total Protein: 8.1 g/dL (ref 6.5–8.1)

## 2022-02-07 LAB — CBC WITH DIFFERENTIAL/PLATELET
Abs Immature Granulocytes: 0.02 10*3/uL (ref 0.00–0.07)
Basophils Absolute: 0 10*3/uL (ref 0.0–0.1)
Basophils Relative: 1 %
Eosinophils Absolute: 0.1 10*3/uL (ref 0.0–0.5)
Eosinophils Relative: 2 %
HCT: 46.4 % (ref 39.0–52.0)
Hemoglobin: 16.1 g/dL (ref 13.0–17.0)
Immature Granulocytes: 1 %
Lymphocytes Relative: 38 %
Lymphs Abs: 1.7 10*3/uL (ref 0.7–4.0)
MCH: 34 pg (ref 26.0–34.0)
MCHC: 34.7 g/dL (ref 30.0–36.0)
MCV: 98.1 fL (ref 80.0–100.0)
Monocytes Absolute: 0.7 10*3/uL (ref 0.1–1.0)
Monocytes Relative: 17 %
Neutro Abs: 1.9 10*3/uL (ref 1.7–7.7)
Neutrophils Relative %: 41 %
Platelets: 152 10*3/uL (ref 150–400)
RBC: 4.73 MIL/uL (ref 4.22–5.81)
RDW: 12.3 % (ref 11.5–15.5)
WBC: 4.4 10*3/uL (ref 4.0–10.5)
nRBC: 0 % (ref 0.0–0.2)

## 2022-02-07 LAB — HEMOGLOBIN A1C
Hgb A1c MFr Bld: 5.3 % (ref 4.8–5.6)
Mean Plasma Glucose: 105.41 mg/dL

## 2022-02-07 LAB — PSA: Prostatic Specific Antigen: 57.43 ng/mL — ABNORMAL HIGH (ref 0.00–4.00)

## 2022-02-07 LAB — VITAMIN D 25 HYDROXY (VIT D DEFICIENCY, FRACTURES): Vit D, 25-Hydroxy: 17.13 ng/mL — ABNORMAL LOW (ref 30–100)

## 2022-02-07 LAB — TSH: TSH: 2.068 u[IU]/mL (ref 0.350–4.500)

## 2022-02-08 LAB — MICROALBUMIN, URINE: Microalb, Ur: 27.1 ug/mL — ABNORMAL HIGH

## 2022-04-16 ENCOUNTER — Encounter: Payer: Self-pay | Admitting: *Deleted

## 2022-04-22 ENCOUNTER — Other Ambulatory Visit: Payer: Self-pay

## 2022-04-22 ENCOUNTER — Emergency Department (HOSPITAL_COMMUNITY)
Admission: EM | Admit: 2022-04-22 | Discharge: 2022-04-22 | Discharge status: Against Medical Advice | Payer: Medicare Other | Attending: Emergency Medicine | Admitting: Emergency Medicine

## 2022-04-22 ENCOUNTER — Encounter (HOSPITAL_COMMUNITY): Payer: Self-pay | Admitting: *Deleted

## 2022-04-22 DIAGNOSIS — H6121 Impacted cerumen, right ear: Secondary | ICD-10-CM | POA: Diagnosis not present

## 2022-04-22 DIAGNOSIS — Z5321 Procedure and treatment not carried out due to patient leaving prior to being seen by health care provider: Secondary | ICD-10-CM | POA: Insufficient documentation

## 2022-04-22 NOTE — ED Triage Notes (Signed)
Pt with trouble with hearing out of right ear, ear wax noted to right ear.

## 2022-04-30 ENCOUNTER — Emergency Department (HOSPITAL_COMMUNITY)
Admission: EM | Admit: 2022-04-30 | Discharge: 2022-04-30 | Disposition: A | Discharge status: Home / Self Care | Payer: Medicare Other | Attending: Emergency Medicine | Admitting: Emergency Medicine

## 2022-04-30 ENCOUNTER — Other Ambulatory Visit: Payer: Self-pay

## 2022-04-30 ENCOUNTER — Encounter (HOSPITAL_COMMUNITY): Payer: Self-pay | Admitting: Emergency Medicine

## 2022-04-30 DIAGNOSIS — I10 Essential (primary) hypertension: Secondary | ICD-10-CM | POA: Diagnosis not present

## 2022-04-30 DIAGNOSIS — Z79899 Other long term (current) drug therapy: Secondary | ICD-10-CM | POA: Insufficient documentation

## 2022-04-30 DIAGNOSIS — R509 Fever, unspecified: Secondary | ICD-10-CM | POA: Diagnosis present

## 2022-04-30 DIAGNOSIS — H6121 Impacted cerumen, right ear: Secondary | ICD-10-CM | POA: Diagnosis not present

## 2022-04-30 MED ORDER — CIPROFLOXACIN-DEXAMETHASONE 0.3-0.1 % OT SUSP
4.0000 [drp] | Freq: Two times a day (BID) | OTIC | 0 refills | Status: AC
Start: 2022-04-30 — End: 2022-05-05

## 2022-04-30 MED ORDER — CARBAMIDE PEROXIDE 6.5 % OT SOLN
5.0000 [drp] | Freq: Once | OTIC | Status: AC
  Administered 2022-04-30: 5 [drp] via OTIC
  Filled 2022-04-30: qty 15

## 2022-04-30 MED ORDER — CARBAMIDE PEROXIDE 6.5 % OT SOLN: 5.0000 [drp] | Freq: Two times a day (BID) | OTIC | 0 refills | Status: DC

## 2022-04-30 NOTE — ED Triage Notes (Signed)
Pt to ER with c/o right ear "stopped up" x 1 week.

## 2022-04-30 NOTE — Discharge Instructions (Signed)
You were seen in the emergency department today for earwax in your right ear.  We were able to remove some.  There is some remaining and I am prescribing you some drops that are called Debrox which helped to soften the wax and help to remove it at home.  I have also prescribed you some antibiotic eardrops to cover for ear infection.  You will use these twice a day for the next 5 days.  Please return to the emergency department for severe swelling of the ear or behind the ear, fevers or significantly reduced hearing.

## 2022-04-30 NOTE — ED Provider Notes (Signed)
[facility] EMERGENCY DEPARTMENT Provider Note   CSN: [id number] Arrival date & time: 04/30/22  1232     History  Chief Complaint  Patient presents with   Ear Fullness    CLEARANCE Christopher Austin is a 66 y.o. male.  With past medical history of hypertension, cardiomyopathy who presents to the emergency department with right ear pain.  Patient states that for the past 1 week he has had fullness in his right ear.  Has not been decreased hearing.  States that this is happened previously and had wax removed.  He denies any drainage from the ear.  Denies any itching, fevers, sore throat, sinus pain or pressure.   Ear Fullness       Home Medications Prior to Admission medications   Medication Sig Start Date End Date Taking? Authorizing Provider  carbamide peroxide (DEBROX) 6.5 % OTIC solution Place 5 drops into the right ear 2 (two) times daily. 04/30/22  Yes Mickie Hillier, PA-C  ciprofloxacin-dexamethasone (CIPRODEX) OTIC suspension Place 4 drops into the right ear 2 (two) times daily for 5 days. 04/30/22 05/05/22 Yes Mickie Hillier, PA-C  albuterol (PROVENTIL HFA;VENTOLIN HFA) 108 (90 Base) MCG/ACT inhaler Inhale 1-2 puffs into the lungs every 6 (six) hours as needed for wheezing or shortness of breath. 02/04/17   Nat Christen, MD  amLODipine (NORVASC) 5 MG tablet Take 5 mg by mouth daily.    [provider]  folic acid (FOLVITE) 1 MG tablet Take 1 mg by mouth daily.    [provider]  Glecaprevir-Pibrentasvir (MAVYRET) 100-40 MG TABS Take by mouth 3 (three) times daily.    [provider]  HYDROcodone-acetaminophen (NORCO/VICODIN) 5-325 MG tablet Take 1 tablet by mouth every 4 (four) hours as needed. Patient not taking: Reported on 06/11/2018 05/09/18   Lily Kocher, PA-C  ibuprofen (ADVIL,MOTRIN) 600 MG tablet Take 1 tablet (600 mg total) by mouth 4 (four) times daily. Patient not taking: Reported on 07/02/2018 05/09/18   Lily Kocher, PA-C  naproxen  (NAPROSYN) 500 MG tablet Take 500 mg by mouth as needed.     [provider]  ramipril (ALTACE) 5 MG capsule TAKE ONE CAPSULE BY MOUTH ONCE DAILY Patient not taking: Reported on 06/11/2018 11/05/15   Satira Sark, MD  sildenafil (REVATIO) 20 MG tablet Take 20 mg by mouth as needed.    [provider]      Allergies    Patient has no known allergies.    Review of Systems   Review of Systems  HENT:  Positive for ear pain. Negative for congestion, ear discharge, sinus pressure, sinus pain, sneezing and sore throat.   All other systems reviewed and are negative.   Physical Exam Updated Vital Signs BP 130/82   Pulse (!) 50   Temp 98.9 F (37.2 C) (Oral)   Resp 18   Ht 5\' 4"  (1.626 m)   Wt 36 kg   SpO2 95%   BMI 13.62 kg/m  Physical Exam Vitals and nursing note reviewed.  HENT:     Head: Normocephalic and atraumatic.     Right Ear: External ear normal. There is impacted cerumen.     Left Ear: Tympanic membrane, ear canal and external ear normal. There is no impacted cerumen.     Mouth/Throat:     Mouth: Mucous membranes are moist.     Pharynx: Oropharynx is clear.  Eyes:     General: No scleral icterus.    Extraocular Movements: Extraocular movements intact.  Pulmonary:     Effort: Pulmonary effort is normal. No respiratory distress.  Skin:    Findings: No rash.  Neurological:     General: No focal deficit present.     Mental Status: He is alert.  Psychiatric:        Mood and Affect: Mood normal.        Behavior: Behavior normal.        Thought Content: Thought content normal.        Judgment: Judgment normal.     ED Results / Procedures / Treatments   Labs (all labs ordered are listed, but only abnormal results are displayed) Labs Reviewed - No data to display  EKG None  Radiology No results found.  Procedures .Ear Cerumen Removal  Date/Time: 04/30/2022 2:53 PM  Performed by: Cristopher Peru, PA-C Authorized by: Cristopher Peru,  PA-C   Consent:    Consent obtained:  Verbal   Consent given by:  Patient   Risks, benefits, and alternatives were discussed: yes     Risks discussed:  Bleeding, infection, pain, TM perforation, incomplete removal and dizziness   Alternatives discussed:  No treatment Universal protocol:    Procedure explained and questions answered to patient or proxy's satisfaction: yes     Relevant documents present and verified: yes     Test results available: yes     Imaging studies available: yes     Required blood products, implants, devices, and special equipment available: yes     Site/side marked: yes     Immediately prior to procedure, a time out was called: yes     Patient identity confirmed:  Verbally with patient Procedure details:    Location:  R ear   Procedure type: irrigation     Procedure outcomes: cerumen removed   Post-procedure details:    Inspection:  Some cerumen remaining and bleeding   Hearing quality:  Improved   Procedure completion:  Tolerated well, no immediate complications     Medications Ordered in ED Medications  carbamide peroxide (DEBROX) 6.5 % OTIC (EAR) solution 5 drop (5 drops Right EAR Given 04/30/22 1349)    ED Course/ Medical Decision Making/ A&P                           Medical Decision Making Risk OTC drugs.   This patient presents to the ED with chief complaint(s) of ear fullness with pertinent past medical history of hypertension, cardiomyopathy which further complicates the presenting complaint. The complaint involves an extensive differential diagnosis and also carries with it a high risk of complications and morbidity.    The differential diagnosis includes acute otitis externa, acute otitis media, malignant otitis, mastoiditis, impacted cerumen, foreign body, etc.  Additional history obtained: Additional history obtained from  none available Records reviewed Care Everywhere/External Records and Primary Care Documents  ED Course and  Reassessment: 66 year old male who presents to the emergency department with ear fullness on the right side.  On physical exam there is impacted cerumen of the right external ear canal.  We used Debrox drops and curette and lavage to help remove some cerumen.  He does have cerumen left but the patient is having improvement in symptoms.  He does have some erythema in the external ear canal.  There is no evidence of malignant otitis externa, mastoiditis on exam.  Given this time and improvement in symptoms, will discharge home with a prescription for Debrox drops and ciprofloxacin  eardrops.  I also inserted a ear wick to help improve penetration of the eardrops.  He verbalized understanding.  Feel that he is safe for discharge at this time.  Independent labs interpretation:  The following labs were independently interpreted: Not indicated  Independent visualization of imaging: Not indictated  Consultation: - Consulted or discussed management/test interpretation w/ external professional: Not indictated  Consideration for admission or further workup: Not indicated Social Determinants of health: Low health literacy Final Clinical Impression(s) / ED Diagnoses Final diagnoses:  Impacted cerumen of right ear    Rx / DC Orders ED Discharge Orders          Ordered    carbamide peroxide (DEBROX) 6.5 % OTIC solution  2 times daily        04/30/22 1457    ciprofloxacin-dexamethasone (CIPRODEX) OTIC suspension  2 times daily        04/30/22 1457              Cristopher Peru, PA-C 04/30/22 1457    Linwood Dibbles, MD 05/01/22 [phone]

## 2023-01-28 ENCOUNTER — Encounter (HOSPITAL_COMMUNITY): Payer: Self-pay | Admitting: Emergency Medicine

## 2023-01-28 ENCOUNTER — Other Ambulatory Visit: Payer: Self-pay

## 2023-01-28 ENCOUNTER — Other Ambulatory Visit (HOSPITAL_COMMUNITY): Payer: Self-pay | Admitting: Adult Health

## 2023-01-28 ENCOUNTER — Emergency Department (HOSPITAL_COMMUNITY)
Admission: EM | Admit: 2023-01-28 | Discharge: 2023-01-28 | Disposition: A | Discharge status: Home / Self Care | Payer: 59 | Attending: Emergency Medicine | Admitting: Emergency Medicine

## 2023-01-28 DIAGNOSIS — N4889 Other specified disorders of penis: Secondary | ICD-10-CM | POA: Diagnosis present

## 2023-01-28 DIAGNOSIS — M7989 Other specified soft tissue disorders: Secondary | ICD-10-CM

## 2023-01-28 DIAGNOSIS — I10 Essential (primary) hypertension: Secondary | ICD-10-CM | POA: Diagnosis not present

## 2023-01-28 DIAGNOSIS — Z79899 Other long term (current) drug therapy: Secondary | ICD-10-CM | POA: Insufficient documentation

## 2023-01-28 DIAGNOSIS — F17210 Nicotine dependence, cigarettes, uncomplicated: Secondary | ICD-10-CM

## 2023-01-28 LAB — URINALYSIS, ROUTINE W REFLEX MICROSCOPIC
Bilirubin Urine: NEGATIVE
Glucose, UA: NEGATIVE mg/dL
Hgb urine dipstick: NEGATIVE
Ketones, ur: NEGATIVE mg/dL
Leukocytes,Ua: NEGATIVE
Nitrite: NEGATIVE
Protein, ur: NEGATIVE mg/dL
Specific Gravity, Urine: 1.016 (ref 1.005–1.030)
pH: 6 (ref 5.0–8.0)

## 2023-01-28 NOTE — ED Triage Notes (Signed)
Pt c/o knots to groin and penis x a few months, painless. Denies penile discharge and itch. No urinary symptoms.

## 2023-01-28 NOTE — Discharge Instructions (Signed)
Your evaluated today for lumps in your penis.  You are not having any other symptoms.  You need to be evaluated by a urologist.  Please call them and make an appointment.  Come back if you have swelling, fever, redness or pain.

## 2023-01-28 NOTE — ED Provider Notes (Signed)
[place] EMERGENCY DEPARTMENT AT [facility] Provider Note   CSN: [id number] Arrival date & time: 01/28/23  1102     History  Chief Complaint  Patient presents with   Groin Swelling    Christopher Austin is a 67 y.o. male.  Has PMH of alcohol abuse, cardiomyopathy, hepatitis C, hypertension.  Presents the ER complaining of lumps in his penis for the past 3 months.  He states he was worried it could be yeast infection after looking at some pictures with his friends last night.  Denies dysuria, no skin changes, no inguinal adenopathy, no fevers or chills.  No penile drainage.  He did have a new sexual partner several months ago but no other partner since then.  HPI     Home Medications Prior to Admission medications   Medication Sig Start Date End Date Taking? Authorizing Provider  albuterol (PROVENTIL HFA;VENTOLIN HFA) 108 (90 Base) MCG/ACT inhaler Inhale 1-2 puffs into the lungs every 6 (six) hours as needed for wheezing or shortness of breath. 02/04/17   Donnetta Hutching, MD  amLODipine (NORVASC) 5 MG tablet Take 5 mg by mouth daily.    [provider]  carbamide peroxide (DEBROX) 6.5 % OTIC solution Place 5 drops into the right ear 2 (two) times daily. 04/30/22   Cristopher Peru, PA-C  folic acid (FOLVITE) 1 MG tablet Take 1 mg by mouth daily.    [provider]  Glecaprevir-Pibrentasvir (MAVYRET) 100-40 MG TABS Take by mouth 3 (three) times daily.    [provider]  HYDROcodone-acetaminophen (NORCO/VICODIN) 5-325 MG tablet Take 1 tablet by mouth every 4 (four) hours as needed. Patient not taking: Reported on 06/11/2018 05/09/18   Ivery Quale, PA-C  ibuprofen (ADVIL,MOTRIN) 600 MG tablet Take 1 tablet (600 mg total) by mouth 4 (four) times daily. Patient not taking: Reported on 07/02/2018 05/09/18   Ivery Quale, PA-C  naproxen (NAPROSYN) 500 MG tablet Take 500 mg by mouth as needed.     [provider]  ramipril (ALTACE) 5 MG capsule  TAKE ONE CAPSULE BY MOUTH ONCE DAILY Patient not taking: Reported on 06/11/2018 11/05/15   Jonelle Sidle, MD  sildenafil (REVATIO) 20 MG tablet Take 20 mg by mouth as needed.    [provider]      Allergies    Patient has no known allergies.    Review of Systems   Review of Systems  Physical Exam Updated Vital Signs BP (!) 126/90   Pulse (!) 50   Temp 99 F (37.2 C)   Resp 18   Ht 5\' 4"  (1.626 m)   Wt 44.5 kg   SpO2 96%   BMI 16.82 kg/m  Physical Exam Vitals and nursing note reviewed. Exam conducted with a chaperone present.  Constitutional:      General: He is not in acute distress.    Appearance: He is well-developed.  HENT:     Head: Normocephalic and atraumatic.     Mouth/Throat:     Mouth: Mucous membranes are moist.  Eyes:     Conjunctiva/sclera: Conjunctivae normal.  Cardiovascular:     Rate and Rhythm: Normal rate and regular rhythm.     Heart sounds: No murmur heard. Pulmonary:     Effort: Pulmonary effort is normal. No respiratory distress.     Breath sounds: Normal breath sounds.  Abdominal:     Palpations: Abdomen is soft.     Tenderness: There is no abdominal tenderness.  Genitourinary:  Testes: Normal.     Comments: Several firm nodules palpated deeply to the shaft of the penis, these are not tender, no skin changes.  Inguinal adenopathy, no testicular swelling or pain. Musculoskeletal:        General: No swelling.     Cervical back: Neck supple.  Skin:    General: Skin is warm and dry.     Capillary Refill: Capillary refill takes less than 2 seconds.  Neurological:     General: No focal deficit present.     Mental Status: He is alert and oriented to person, place, and time.  Psychiatric:        Mood and Affect: Mood normal.     ED Results / Procedures / Treatments   Labs (all labs ordered are listed, but only abnormal results are displayed) Labs Reviewed  URINALYSIS, ROUTINE W REFLEX MICROSCOPIC - Abnormal; Notable for  the following components:      Result Value   Color, Urine AMBER (*)    All other components within normal limits  GC/CHLAMYDIA PROBE AMP ([place]) NOT AT [facility]    EKG None  Radiology No results found.  Procedures Procedures    Medications Ordered in ED Medications - No data to display  ED Course/ Medical Decision Making/ A&P                             Medical Decision Making This patient presents to the ED for concern of lumps in the penis, this involves an extensive number of treatment options, and is a complaint that carries with it a high risk of complications and morbidity.  The differential diagnosis includes STI, Peyroni's disease, penile cancer,  Lab Tests:  I Ordered, and personally interpreted labs.  The pertinent results include: UA is negative     Problem List / ED Course / Critical interventions / Medication management  Has noted 3 months of painless lumps in his shaft of his penis.  No skin changes, no urinary symptoms, no discharge, no inguinal adenopathy on exam.  I do palpate some firm deep penile lesions there is no crepitus swelling or redness.  Discussed with patient this may be scar tissue.  He denies any trauma.  Discussed need for outpatient urology follow-up.  Do not feel he needs any emergent evaluation beyond what he received today.  He did have a new sexual partner so GC chlamydia were sent off but he is having no symptoms, do not feel that is related his complaint today.  No need for empiric treatment.  I have reviewed the patients home medicines and have made adjustments as needed   Amount and/or Complexity of Data Reviewed Labs: ordered.           Final Clinical Impression(s) / ED Diagnoses Final diagnoses:  Mass of soft tissue    Rx / DC Orders ED Discharge Orders     None         Josem Kaufmann 01/28/23 1448    Terrilee Files, MD 01/28/23 1754

## 2023-01-30 LAB — GC/CHLAMYDIA PROBE AMP (~~LOC~~) NOT AT ARMC
Chlamydia: NEGATIVE
Comment: NEGATIVE
Comment: NORMAL
Neisseria Gonorrhea: NEGATIVE

## 2023-02-06 ENCOUNTER — Emergency Department (HOSPITAL_COMMUNITY)
Admission: EM | Admit: 2023-02-06 | Discharge: 2023-02-06 | Disposition: A | Discharge status: Home / Self Care | Payer: 59 | Attending: Emergency Medicine | Admitting: Emergency Medicine

## 2023-02-06 ENCOUNTER — Other Ambulatory Visit: Payer: Self-pay

## 2023-02-06 ENCOUNTER — Encounter (HOSPITAL_COMMUNITY): Payer: Self-pay

## 2023-02-06 DIAGNOSIS — I10 Essential (primary) hypertension: Secondary | ICD-10-CM | POA: Diagnosis not present

## 2023-02-06 DIAGNOSIS — W57XXXA Bitten or stung by nonvenomous insect and other nonvenomous arthropods, initial encounter: Secondary | ICD-10-CM | POA: Insufficient documentation

## 2023-02-06 DIAGNOSIS — S60561A Insect bite (nonvenomous) of right hand, initial encounter: Secondary | ICD-10-CM | POA: Diagnosis present

## 2023-02-06 DIAGNOSIS — Z79899 Other long term (current) drug therapy: Secondary | ICD-10-CM | POA: Insufficient documentation

## 2023-02-06 MED ORDER — DIPHENHYDRAMINE HCL 25 MG PO CAPS
25.0000 mg | ORAL_CAPSULE | Freq: Once | ORAL | Status: AC
  Administered 2023-02-06: 25 mg via ORAL
  Filled 2023-02-06: qty 1

## 2023-02-06 NOTE — Discharge Instructions (Signed)
You were seen today for concerns for an insect bite to the right hand.  Keep hand elevated.  Take Benadryl as needed.

## 2023-02-06 NOTE — ED Provider Notes (Signed)
[facility]MENT AT [facility] Provider Note   CSN: [id number] Arrival date & time: 02/06/23  0258     History  Chief Complaint  Patient presents with   Insect Bite    Christopher Austin is a 67 y.o. male.  HPI     This is a 67 year old male who presents with concern for an insect bite right hand.  States he noted a bug biting his right hand earlier yesterday.  He describes itching and swelling isolated to the hand.  No rash.  No shortness of breath or throat swelling.  Denies nausea or vomiting.  Home Medications Prior to Admission medications   Medication Sig Start Date End Date Taking? Authorizing Provider  albuterol (PROVENTIL HFA;VENTOLIN HFA) 108 (90 Base) MCG/ACT inhaler Inhale 1-2 puffs into the lungs every 6 (six) hours as needed for wheezing or shortness of breath. 02/04/17   Donnetta Hutching, MD  amLODipine (NORVASC) 5 MG tablet Take 5 mg by mouth daily.    [provider]  carbamide peroxide (DEBROX) 6.5 % OTIC solution Place 5 drops into the right ear 2 (two) times daily. 04/30/22   Cristopher Peru, PA-C  folic acid (FOLVITE) 1 MG tablet Take 1 mg by mouth daily.    [provider]  Glecaprevir-Pibrentasvir (MAVYRET) 100-40 MG TABS Take by mouth 3 (three) times daily.    [provider]  HYDROcodone-acetaminophen (NORCO/VICODIN) 5-325 MG tablet Take 1 tablet by mouth every 4 (four) hours as needed. Patient not taking: Reported on 06/11/2018 05/09/18   Ivery Quale, PA-C  ibuprofen (ADVIL,MOTRIN) 600 MG tablet Take 1 tablet (600 mg total) by mouth 4 (four) times daily. Patient not taking: Reported on 07/02/2018 05/09/18   Ivery Quale, PA-C  naproxen (NAPROSYN) 500 MG tablet Take 500 mg by mouth as needed.     [provider]  ramipril (ALTACE) 5 MG capsule TAKE ONE CAPSULE BY MOUTH ONCE DAILY Patient not taking: Reported on 06/11/2018 11/05/15   Jonelle Sidle, MD  sildenafil (REVATIO) 20 MG tablet Take 20  mg by mouth as needed.    [provider]      Allergies    Patient has no known allergies.    Review of Systems   Review of Systems  Musculoskeletal:        Hand swelling and itching  All other systems reviewed and are negative.   Physical Exam Updated Vital Signs BP (!) 128/115 (BP Location: Left Arm)   Pulse 73   Temp 98.6 F (37 C) (Oral)   Resp 17   SpO2 94%  Physical Exam Vitals and nursing note reviewed.  Constitutional:      Appearance: He is well-developed. He is not ill-appearing.  HENT:     Head: Normocephalic and atraumatic.  Eyes:     Pupils: Pupils are equal, round, and reactive to light.  Cardiovascular:     Rate and Rhythm: Normal rate and regular rhythm.     Heart sounds: Normal heart sounds. No murmur heard. Pulmonary:     Effort: Pulmonary effort is normal. No respiratory distress.     Breath sounds: Normal breath sounds. No wheezing.  Abdominal:     Palpations: Abdomen is soft.     Tenderness: There is no abdominal tenderness.  Musculoskeletal:     Cervical back: Neck supple.     Comments: Slight swelling noted to the dorsum of the right hand, no appreciable bite, no significant erythema or warmth, 2+ radial pulse  Lymphadenopathy:     Cervical: No cervical adenopathy.  Skin:    General: Skin is warm and dry.  Neurological:     Mental Status: He is alert and oriented to person, place, and time.  Psychiatric:        Mood and Affect: Mood normal.     ED Results / Procedures / Treatments   Labs (all labs ordered are listed, but only abnormal results are displayed) Labs Reviewed - No data to display  EKG None  Radiology No results found.  Procedures Procedures    Medications Ordered in ED Medications  diphenhydrAMINE (BENADRYL) capsule 25 mg (25 mg Oral Given 02/06/23 0320)    ED Course/ Medical Decision Making/ A&P                             Medical Decision Making  This patient presents to the ED for concern of  insect bite, this involves an extensive number of treatment options, and is a complaint that carries with it a high risk of complications and morbidity.  I considered the following differential and admission for this acute, potentially life threatening condition.  The differential diagnosis includes local reaction, anaphylaxis  MDM:    This is a 67 year old male who presents with concern for an insect bite to the right hand.  He is nontoxic and vital signs are reassuring.  No signs or symptoms of systemic response or anaphylaxis.  Suspect local allergic response.  Patient was given a dose of Benadryl.  Recommend Benadryl as needed.  Can keep the hand iced and elevated.  (Labs, imaging, consults)  Labs: I Ordered, and personally interpreted labs.  The pertinent results include: None  Imaging Studies ordered: I ordered imaging studies including none I independently visualized and interpreted imaging. I agree with the radiologist interpretation  Additional history obtained from chart review.  External records from outside source obtained and reviewed including prior evaluations  Cardiac Monitoring: The patient was not maintained on a cardiac monitor.  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   Chronic hepatitis C (HCC)    Chronic pain    Essential hypertension, benign    Headache(784.0)    History of alcohol abuse    History of seizures    none since the 1990s   PSVT (paroxysmal supraventricular  tachycardia)    Noted during exercise testing     Medicines Meds ordered this encounter  Medications   diphenhydrAMINE (BENADRYL) capsule 25 mg    I have reviewed the patients home medicines and have made adjustments as needed  Problem List / ED Course: Problem List Items Addressed This Visit   None Visit Diagnoses     Insect bite of right hand, initial encounter    -  Primary                   Final Clinical Impression(s) / ED Diagnoses Final diagnoses:  Insect bite of right hand, initial encounter    Rx / DC Orders  ED Discharge Orders     None         Burt Piatek, Mayer Masker, MD 02/06/23 [phone]

## 2023-02-06 NOTE — ED Triage Notes (Signed)
Pt states he was bite by a little green bug at 7pm last night on the right hand and is now c/o swelling and itching to site.

## 2023-02-11 ENCOUNTER — Ambulatory Visit (INDEPENDENT_AMBULATORY_CARE_PROVIDER_SITE_OTHER): Payer: 59 | Admitting: Urology

## 2023-02-11 VITALS — BP 134/84 | HR 101

## 2023-02-11 DIAGNOSIS — N486 Induration penis plastica: Secondary | ICD-10-CM

## 2023-02-11 DIAGNOSIS — N481 Balanitis: Secondary | ICD-10-CM

## 2023-02-11 NOTE — Progress Notes (Signed)
02/11/2023 2:58 PM   Christopher Austin [birth year] [id number]  Referring provider: Ponciano Austin [facility] [address] [place],  [place] [address]  Penile irritation   HPI: Christopher Austin is a 67yo here for evaluation of penile irritation and concern for penile lesion. 1 month ago he developed penile pain after intercourse and noted to have a red lesion of his penis on the glans. He was using neosporin which made the irritation worse. The irritation and pain resolved after stopping neosporin. He denies any significant LUTS. He also noted a firm area next to the area of irritation which is new. He denies any penile trauma. He denies any curvature with erections.   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 10 year screening   Right hand surgery  2011   Abscess drainage    Home Medications:  Allergies as of 02/11/2023   No Known Allergies      Medication List        Accurate as of February 11, 2023  2:58 PM. If you have any questions, ask your nurse or doctor.          albuterol 108 (90 Base) MCG/ACT inhaler Commonly known as: VENTOLIN HFA Inhale 1-2 puffs into the lungs every 6 (six) hours as needed for wheezing or shortness of breath.   amLODipine 5 MG tablet Commonly known as: NORVASC Take 5 mg by mouth daily.   carbamide peroxide 6.5 % OTIC solution Commonly known as: DEBROX Place 5 drops into the right  ear 2 (two) times daily.   folic acid 1 MG tablet Commonly known as: FOLVITE Take 1 mg by mouth daily.   HYDROcodone-acetaminophen 5-325 MG tablet Commonly known as: NORCO/VICODIN Take 1 tablet by mouth every 4 (four) hours as needed.   ibuprofen 600 MG tablet Commonly known as: ADVIL Take 1 tablet (600 mg total) by mouth 4 (four) times daily.   Mavyret 100-40 MG Tabs Generic drug: Glecaprevir-Pibrentasvir Take by mouth 3 (three) times daily.   naproxen 500 MG tablet Commonly known as: NAPROSYN Take 500 mg by mouth as needed.   ramipril 5 MG capsule Commonly known as: ALTACE TAKE ONE CAPSULE BY MOUTH ONCE DAILY   sildenafil 20 MG tablet Commonly known as: REVATIO Take 20 mg by mouth as needed.        Allergies: No Known Allergies  Family History: Family History  Problem Relation Age of Onset   Hypertension Mother    Hyperlipidemia Mother    Colon cancer Neg Hx    Colon polyps Neg Hx     Social History:  reports that he has been smoking cigarettes. He started smoking about 50 years ago. He has a 50.5 pack-year smoking history. He has never used smokeless tobacco. He reports current alcohol use of about 3.0 standard drinks of alcohol per week. He reports that  he does not use drugs.  ROS: All other review of systems were reviewed and are negative except what is noted above in HPI  Physical Exam: BP 134/84   Pulse (!) 101   Constitutional:  Alert and oriented, No acute distress. HEENT: [place] AT, moist mucus membranes.  Trachea midline, no masses. Cardiovascular: No clubbing, cyanosis, or edema. Respiratory: Normal respiratory effort, no increased work of breathing. GI: Abdomen is soft, nontender, nondistended, no abdominal masses GU: No CVA tenderness. Circumcised phallus. No masses/lesions on penis, testis, scrotum. 2cm palpable dorsal mid penile shaft peyronies plaque  Lymph: No cervical or inguinal lymphadenopathy. Skin: No rashes, bruises or suspicious  lesions. Neurologic: Grossly intact, no focal deficits, moving all 4 extremities. Psychiatric: Normal mood and affect.  Laboratory Data: Lab Results  Component Value Date   WBC 4.4 02/07/2022   HGB 16.1 02/07/2022   HCT 46.4 02/07/2022   MCV 98.1 02/07/2022   PLT 152 02/07/2022    Lab Results  Component Value Date   CREATININE 0.65 02/07/2022    No results found for: "PSA"  No results found for: "TESTOSTERONE"  Lab Results  Component Value Date   HGBA1C 5.3 02/07/2022    Urinalysis    Component Value Date/Time   COLORURINE AMBER (A) 01/28/2023 1343   APPEARANCEUR CLEAR 01/28/2023 1343   LABSPEC 1.016 01/28/2023 1343   PHURINE 6.0 01/28/2023 1343   GLUCOSEU NEGATIVE 01/28/2023 1343   HGBUR NEGATIVE 01/28/2023 1343   BILIRUBINUR NEGATIVE 01/28/2023 1343   KETONESUR NEGATIVE 01/28/2023 1343   PROTEINUR NEGATIVE 01/28/2023 1343   UROBILINOGEN 0.2 05/19/2012 1201   NITRITE NEGATIVE 01/28/2023 1343   LEUKOCYTESUR NEGATIVE 01/28/2023 1343    No results found for: "LABMICR", "WBCUA", "RBCUA", "LABEPIT", "MUCUS", "BACTERIA"  Pertinent Imaging:  No results found for this or any previous visit.  No results found for this or any previous visit.  No results found for this or any previous visit.  No results found for this or any previous visit.  No results found for this or any previous visit.  No valid procedures specified. No results found for this or any previous visit.  No results found for this or any previous visit.   Assessment & Plan:    Peyronies Disease We discussed the management of peyronies disease including medical therapy, penile plication, verapamil therapy and xiaflex therapy. After discussed the options the patient elects for observation since he is asymptomatic. Followup PRN No follow-ups on file.  Christopher Aye, MD  [facility]

## 2023-02-17 ENCOUNTER — Encounter: Payer: Self-pay | Admitting: Urology

## 2023-02-17 NOTE — Patient Instructions (Signed)
Collagenase Injection (Dupuytren Contracture/Peyronie Disease) What is this medication? COLLAGENASE (kohl LAH jen ace) treats conditions caused by thickening of tissue in your body. It works by breaking down excess collagen in the tissue, which reduces stiffness and tightness. This medicine may be used for other purposes; ask your health care provider or pharmacist if you have questions. COMMON BRAND NAME(S): Xiaflex What should I tell my care team before I take this medication? They need to know if you have any of these conditions: Bleeding disorder An unusual or allergic reaction to collagenase, other medications, foods, dyes, or preservatives Pregnant or trying to get pregnant Breast-feeding How should I use this medication? This medication is injected into the affected area. It is given by your care team in a hospital or clinic setting. A special MedGuide will be given to you by the pharmacist with each prescription and refill. Be sure to read this information carefully each time. Talk to your care team about the use of this medication in children. Special care may be needed. Overdosage: If you think you have taken too much of this medicine contact a poison control center or emergency room at once. NOTE: This medicine is only for you. Do not share this medicine with others. What if I miss a dose? Keep appointments for follow-up doses. It is important not to miss your dose. Call your care team if you are unable to keep an appointment. What may interact with this medication? Aspirin and aspirin-like medications Certain medications that treat or prevent blood clots, such as warfarin, enoxaparin, dalteparin, apixaban, dabigatran, rivaroxaban This list may not describe all possible interactions. Give your health care provider a list of all the medicines, herbs, non-prescription drugs, or dietary supplements you use. Also tell them if you smoke, drink alcohol, or use illegal drugs. Some items may  interact with your medicine. What should I watch for while using this medication? Your condition will be monitored carefully while you are receiving this medication. If medication is for Dupuytren's Contracture, visit your care team 1 to 3 days after the injection. Until you visit your care team, do not flex or extend the fingers of your hand that was injected. Do not touch your finger that was injected. Elevate your hand until bedtime. Do not perform activity with the injected hand until you are told that it is ok. Follow any instructions about wearing a splint or performing finger exercises. Contact your care team as soon as possible if you get increasing redness or swelling in the hand, have numbness or tingling in the treated finger, or have trouble bending the finger after the swelling goes down. If medication is for Peyronie's disease, do not have sex between the first and second injections. Wait 4 weeks after the second injection and when there is no more pain or swelling in the penis to have sex. Avoid using vacuum erection devices during treatment with this medication. Try to avoid straining stomach muscles such as during bowel movements. Your care team will give you instructions on how to perform modeling activities at home. Contact your care team as soon as possible if you have severe pain or swelling in the penis, severe purple bruising and swelling of the penis, trouble passing urine, blood in urine, popping or cracking sound form the penis, or sudden loss of ability to maintain an erection. What side effects may I notice from receiving this medication? Side effects that you should report to your care team as soon as possible: Allergic reactions--skin rash,   itching, hives, swelling of the face, lips, tongue, or throat Feeling faint or lightheaded Skin infection--skin redness, swelling, warmth, or pain Severe back pain, chest pain, headache, trouble breathing after injection Snap or pop that  you feel or hear, severe pain, numbness, swelling, or bruising of or trouble moving in area where injected Side effects that usually do not require medical attention (report to your care team if they continue or are bothersome): Pain, redness, or irritation at injection site This list may not describe all possible side effects. Call your doctor for medical advice about side effects. You may report side effects to FDA at 1-800-FDA-1088. Where should I keep my medication? This medication is given in a hospital or clinic. It will not be stored at home. NOTE: This sheet is a summary. It may not cover all possible information. If you have questions about this medicine, talk to your doctor, pharmacist, or health care provider.  2024 Elsevier/Gold Standard (2021-06-28 00:00:00)  

## 2023-03-05 ENCOUNTER — Other Ambulatory Visit (HOSPITAL_COMMUNITY): Payer: Self-pay | Admitting: Adult Health

## 2023-03-05 DIAGNOSIS — F17210 Nicotine dependence, cigarettes, uncomplicated: Secondary | ICD-10-CM

## 2023-06-12 LAB — LAB REPORT - SCANNED: EGFR: 99

## 2023-06-15 ENCOUNTER — Other Ambulatory Visit (HOSPITAL_COMMUNITY): Payer: Self-pay | Admitting: Family Medicine

## 2023-06-15 DIAGNOSIS — F17210 Nicotine dependence, cigarettes, uncomplicated: Secondary | ICD-10-CM

## 2023-06-22 ENCOUNTER — Ambulatory Visit (HOSPITAL_COMMUNITY)
Admission: RE | Admit: 2023-06-22 | Discharge: 2023-06-22 | Disposition: A | Discharge status: Home / Self Care | Payer: 59 | Source: Ambulatory Visit | Attending: Family Medicine | Admitting: Family Medicine

## 2023-06-22 DIAGNOSIS — F17210 Nicotine dependence, cigarettes, uncomplicated: Secondary | ICD-10-CM

## 2023-06-24 ENCOUNTER — Ambulatory Visit (INDEPENDENT_AMBULATORY_CARE_PROVIDER_SITE_OTHER): Payer: 59 | Admitting: Urology

## 2023-06-24 ENCOUNTER — Ambulatory Visit (HOSPITAL_COMMUNITY)
Admission: RE | Admit: 2023-06-24 | Discharge: 2023-06-24 | Disposition: A | Discharge status: Home / Self Care | Payer: 59 | Source: Ambulatory Visit | Attending: Family Medicine | Admitting: Family Medicine

## 2023-06-24 VITALS — BP 132/73 | HR 53

## 2023-06-24 DIAGNOSIS — F17210 Nicotine dependence, cigarettes, uncomplicated: Secondary | ICD-10-CM | POA: Diagnosis present

## 2023-06-24 DIAGNOSIS — R972 Elevated prostate specific antigen [PSA]: Secondary | ICD-10-CM | POA: Diagnosis not present

## 2023-06-24 NOTE — Progress Notes (Signed)
06/24/2023 10:14 AM   Christopher Austin 1956/03/31 [id number]  Referring provider: Ponciano Ort [facility] [address] [place],  [place] [address]  Elevated PSA  HPI: Christopher Austin is a 67yo here for evaluation for elevated PSA. PSA was 57 in 01/2022. He denies any worsening LUTS. No bone pain. No hx of prostate biopsy. NO unexplained weight loss. No other complaints today.    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 10 year screening   Right hand surgery  2011   Abscess drainage    Home Medications:  Allergies as of 06/24/2023   No Known Allergies      Medication List        Accurate as of June 24, 2023 10:14 AM. If you have any questions, ask your nurse or doctor.          albuterol 108 (90 Base) MCG/ACT inhaler Commonly known as: VENTOLIN HFA Inhale 1-2 puffs into the lungs every 6 (six) hours as needed for wheezing or shortness of breath.   amLODipine 5 MG tablet Commonly known as: NORVASC Take 5 mg by mouth daily.   carbamide peroxide 6.5 % OTIC solution Commonly known as: DEBROX Place 5 drops into the right ear 2 (two) times daily.   folic acid 1 MG tablet Commonly known as: FOLVITE Take 1 mg by mouth daily.   HYDROcodone-acetaminophen 5-325 MG tablet Commonly known as: NORCO/VICODIN Take 1 tablet by mouth every 4 (four) hours as needed.   ibuprofen 600 MG tablet Commonly known  as: ADVIL Take 1 tablet (600 mg total) by mouth 4 (four) times daily.   Mavyret 100-40 MG Tabs Generic drug: Glecaprevir-Pibrentasvir Take by mouth 3 (three) times daily.   naproxen 500 MG tablet Commonly known as: NAPROSYN Take 500 mg by mouth as needed.   ramipril 5 MG capsule Commonly known as: ALTACE TAKE ONE CAPSULE BY MOUTH ONCE DAILY   sildenafil 20 MG tablet Commonly known as: REVATIO Take 20 mg by mouth as needed.        Allergies: No Known Allergies  Family History: Family History  Problem Relation Age of Onset   Hypertension Mother    Hyperlipidemia Mother    Colon cancer Neg Hx    Colon polyps Neg Hx     Social History:  reports that he has been smoking cigarettes. He started smoking about 50 years ago. He has a 50.9 pack-year smoking history. He has never used smokeless tobacco. He reports current alcohol use of about 3.0 standard drinks of alcohol per week. He reports that he does not use drugs.  ROS: All other review of systems were reviewed and are negative except what is noted above in HPI  Physical Exam: BP 132/73   Pulse (!) 53   Constitutional:  Alert and oriented, No acute distress. HEENT: Christopher Austin AT, moist mucus membranes.  Trachea midline, no masses. Cardiovascular: No clubbing, cyanosis, or edema. Respiratory: Normal respiratory effort, no increased work of breathing. GI: Abdomen is soft, nontender, nondistended, no abdominal masses GU: No CVA tenderness.  Lymph: No cervical or inguinal lymphadenopathy. Skin: No rashes, bruises or suspicious lesions. Neurologic: Grossly intact, no focal deficits, moving all 4 extremities. Psychiatric: Normal mood and affect.  Laboratory Data: Lab Results  Component Value Date   WBC 4.4 02/07/2022   HGB 16.1 02/07/2022   HCT 46.4 02/07/2022   MCV 98.1 02/07/2022   PLT 152 02/07/2022    Lab Results  Component Value Date   CREATININE 0.65 02/07/2022    No results found for: "PSA"  No results found  for: "TESTOSTERONE"  Lab Results  Component Value Date   HGBA1C 5.3 02/07/2022    Urinalysis    Component Value Date/Time   COLORURINE AMBER (A) 01/28/2023 1343   APPEARANCEUR CLEAR 01/28/2023 1343   LABSPEC 1.016 01/28/2023 1343   PHURINE 6.0 01/28/2023 1343   GLUCOSEU NEGATIVE 01/28/2023 1343   HGBUR NEGATIVE 01/28/2023 1343   BILIRUBINUR NEGATIVE 01/28/2023 1343   KETONESUR NEGATIVE 01/28/2023 1343   PROTEINUR NEGATIVE 01/28/2023 1343   UROBILINOGEN 0.2 05/19/2012 1201   NITRITE NEGATIVE 01/28/2023 1343   LEUKOCYTESUR NEGATIVE 01/28/2023 1343    No results found for: "LABMICR", "WBCUA", "RBCUA", "LABEPIT", "MUCUS", "BACTERIA"  Pertinent Imaging: *** No results found for this or any previous visit.  No results found for this or any previous visit.  No results found for this or any previous visit.  No results found for this or any previous visit.  No results found for this or any previous visit.  No valid procedures specified. No results found for this or any previous visit.  No results found for this or any previous visit.   Assessment & Plan:    1. Elevated PSA The patient and I talked about etiologies of elevated PSA.  We discussed the possible relationship between elevated PSA, prostate cancer, BPH, prostatitis, and UTI.   Conservative treatment of elevated PSA with watchful waiting was discussed with the patient.  All questions were answered.        All of the risks and benefits along with alternatives to prostate biopsy were discussed with the patient.  The patient gave fully informed consent to proceed with a transrectal ultrasound guided biopsy of the prostate for the evaluation of their evated PSA.  Prostate biopsy instructions and antibiotics were given to the patient.    No follow-ups on file.  Wilkie Aye, MD  [facility] [place]

## 2023-06-25 LAB — PSA: Prostate Specific Ag, Serum: 51.1 ng/mL — ABNORMAL HIGH (ref 0.0–4.0)

## 2023-06-27 ENCOUNTER — Encounter: Payer: Self-pay | Admitting: Urology

## 2023-06-30 ENCOUNTER — Telehealth: Payer: Self-pay

## 2023-06-30 DIAGNOSIS — R972 Elevated prostate specific antigen [PSA]: Secondary | ICD-10-CM

## 2023-06-30 MED ORDER — LEVOFLOXACIN 750 MG PO TABS: 750.0000 mg | ORAL_TABLET | Freq: Once | ORAL | 0 refills | Status: DC

## 2023-06-30 NOTE — Telephone Encounter (Signed)
Patient called and made aware. Prostate biopsy scheduled, instructions went over with patient via phone, and sent via mail. Orders placed.

## 2023-06-30 NOTE — Telephone Encounter (Signed)
-----   Message from Wilkie Aye sent at 06/30/2023  8:36 AM EST ----- PSA remains high. He needs to proceed with prostate biopsy ----- Message ----- From: Interface, [facility] Lab Results In Sent: 06/25/2023   5:37 AM EST To: Malen Gauze, MD

## 2023-07-01 ENCOUNTER — Other Ambulatory Visit: Payer: Self-pay

## 2023-07-01 MED ORDER — LEVOFLOXACIN 750 MG PO TABS: 750.0000 mg | ORAL_TABLET | Freq: Every day | ORAL | 0 refills | Status: DC

## 2023-07-29 DIAGNOSIS — R911 Solitary pulmonary nodule: Secondary | ICD-10-CM

## 2023-07-29 HISTORY — DX: Solitary pulmonary nodule: R91.1

## 2023-08-12 ENCOUNTER — Other Ambulatory Visit: Payer: 59 | Admitting: Urology

## 2023-08-12 ENCOUNTER — Ambulatory Visit (HOSPITAL_COMMUNITY): Admission: RE | Admit: 2023-08-12 | Payer: 59 | Source: Ambulatory Visit

## 2023-08-23 NOTE — Progress Notes (Deleted)
MAANAV KASSABIAN, male    DOB: 09/29/1955    MRN: [id number]   Brief patient profile:  57  yobm   *** referred to pulmonary clinic in [place]  08/24/2023 by *** for ***      History of Present Illness  08/24/2023  Pulmonary/ 1st office eval/ Sherene Sires / [place] [facility]  No chief complaint on file.    Dyspnea:  *** Cough: *** Sleep: *** SABA use: *** 02: *** LDSCT:***  No obvious day to day or daytime pattern/variability or assoc excess/ purulent sputum or mucus plugs or hemoptysis or cp or chest tightness, subjective wheeze or overt sinus or hb symptoms.    Also denies any obvious fluctuation of symptoms with weather or environmental changes or other aggravating or alleviating factors except as outlined above   No unusual exposure hx or h/o childhood pna/ asthma or knowledge of premature birth.  Current Allergies, Complete Past Medical History, Past Surgical History, Family History, and Social History were reviewed in [employer] record.  ROS  The following are not active complaints unless bolded Hoarseness, sore throat, dysphagia, dental problems, itching, sneezing,  nasal congestion or discharge of excess mucus or purulent secretions, ear ache,   fever, chills, sweats, unintended wt loss or wt gain, classically pleuritic or exertional cp,  orthopnea pnd or arm/hand swelling  or leg swelling, presyncope, palpitations, abdominal pain, anorexia, nausea, vomiting, diarrhea  or change in bowel habits or change in bladder habits, change in stools or change in urine, dysuria, hematuria,  rash, arthralgias, visual complaints, headache, numbness, weakness or ataxia or problems with walking or coordination,  change in mood or  memory.            Outpatient Medications Prior to Visit  Medication Sig Dispense Refill   albuterol (PROVENTIL HFA;VENTOLIN HFA) 108 (90 Base) MCG/ACT inhaler Inhale 1-2 puffs into the lungs every 6 (six) hours as needed for wheezing or  shortness of breath. (Patient not taking: Reported on 02/11/2023) 1 Inhaler 2   amLODipine (NORVASC) 5 MG tablet Take 5 mg by mouth daily.     carbamide peroxide (DEBROX) 6.5 % OTIC solution Place 5 drops into the right ear 2 (two) times daily. (Patient not taking: Reported on 02/11/2023) 15 mL 0   folic acid (FOLVITE) 1 MG tablet Take 1 mg by mouth daily.     Glecaprevir-Pibrentasvir (MAVYRET) 100-40 MG TABS Take by mouth 3 (three) times daily. (Patient not taking: Reported on 02/11/2023)     HYDROcodone-acetaminophen (NORCO/VICODIN) 5-325 MG tablet Take 1 tablet by mouth every 4 (four) hours as needed. (Patient not taking: Reported on 06/11/2018) 15 tablet 0   ibuprofen (ADVIL,MOTRIN) 600 MG tablet Take 1 tablet (600 mg total) by mouth 4 (four) times daily. (Patient not taking: Reported on 07/02/2018) 30 tablet 0   levofloxacin (LEVAQUIN) 750 MG tablet Take 1 tablet (750 mg total) by mouth daily. Take 1 hour prior to your prostate biopsy procedure. 1 tablet 0   naproxen (NAPROSYN) 500 MG tablet Take 500 mg by mouth as needed.      ramipril (ALTACE) 5 MG capsule TAKE ONE CAPSULE BY MOUTH ONCE DAILY (Patient not taking: No sig reported) 90 capsule 3   sildenafil (REVATIO) 20 MG tablet Take 20 mg by mouth as needed.     No facility-administered medications prior to visit.    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         Assessment   No problem-specific Assessment & Plan notes found for this encounter.     Sandrea Hughs, MD 08/23/2023

## 2023-08-24 ENCOUNTER — Ambulatory Visit: Payer: 59 | Admitting: Urology

## 2023-08-24 ENCOUNTER — Institutional Professional Consult (permissible substitution): Payer: 59 | Admitting: Internal Medicine

## 2023-08-24 ENCOUNTER — Encounter: Payer: Self-pay | Admitting: Internal Medicine

## 2023-09-30 NOTE — Progress Notes (Unsigned)
 Christopher Austin, male    DOB: Jun 23, 1956    MRN: [id number]   Brief patient profile:  51  yobm active smoker  referred to pulmonary clinic in [place]  10/01/2023 by [facility] clinic for abn LDSCT / SPN LLL  LDSCT  06/24/23  - 17.9 mm left lower lobe pulmonary nodule, highly suspicious for primary bronchogenic carcinoma. 2. No thoracic adenopathy 3. Esophageal air fluid level suggests dysmotility or gastroesophageal reflux. 4. Aortic atherosclerosis (ICD10-I70.0), coronary artery atherosclerosis and emphysema =Moderate centrilobular emphysema.      History of Present Illness  10/01/2023  Pulmonary/ 1st office eval/ Orvell Careaga / [facility] on ACEi  Chief Complaint  Patient presents with   Consult    Abnormal chest scan    Dyspnea:  limited by leg pain not breathing Ok with yardwork but usually riding mower Cough: some in am yellowish  Sleep: flat bed/ one pillow no proble SABA use: not using any 02: none    No obvious day to day or daytime pattern/variability or assoc excess/ purulent sputum or mucus plugs or hemoptysis or cp or chest tightness, subjective wheeze or overt sinus or hb symptoms.    Also denies any obvious fluctuation of symptoms with weather or environmental changes or other aggravating or alleviating factors except as outlined above   No unusual exposure hx or h/o childhood pna/ asthma or knowledge of premature birth.  Current Allergies, Complete Past Medical History, Past Surgical History, Family History, and Social History were reviewed in [employer] record.  ROS  The following are not active complaints unless bolded Hoarseness, sore throat, dysphagia, dental problems, itching, sneezing,  nasal congestion or discharge of excess mucus or purulent secretions, ear ache,   fever, chills, sweats, unintended wt loss attributed to heavy drinking or wt gain, classically pleuritic or exertional cp,  orthopnea pnd or arm/hand swelling  or leg  swelling, presyncope, palpitations, abdominal pain, anorexia, nausea, vomiting, diarrhea  or change in bowel habits or change in bladder habits, change in stools or change in urine, dysuria, hematuria,  rash, arthralgias, visual complaints, headache, numbness, weakness or ataxia or problems with walking or coordination,  change in mood or  memory.            Outpatient Medications Prior to Visit  Medication Sig Dispense Refill   amLODipine (NORVASC) 5 MG tablet Take 5 mg by mouth daily.     mirtazapine (REMERON) 15 MG tablet Take 15 mg by mouth daily.     omeprazole (PRILOSEC) 20 MG capsule Take 20 mg by mouth daily.     albuterol (PROVENTIL HFA;VENTOLIN HFA) 108 (90 Base) MCG/ACT inhaler Inhale 1-2 puffs into the lungs every 6 (six) hours as needed for wheezing or shortness of breath. (Patient not taking: Reported on 10/01/2023) 1 Inhaler 2   carbamide peroxide (DEBROX) 6.5 % OTIC solution Place 5 drops into the right ear 2 (two) times daily. (Patient not taking: Reported on 02/11/2023) 15 mL 0   folic acid (FOLVITE) 1 MG tablet Take 1 mg by mouth daily.     sildenafil (REVATIO) 20 MG tablet Take 20 mg by mouth as needed. (Patient not taking: Reported on 10/01/2023)     Glecaprevir-Pibrentasvir (MAVYRET) 100-40 MG TABS Take by mouth 3 (three) times daily. (Patient not taking: Reported on 02/11/2023)     HYDROcodone-acetaminophen (NORCO/VICODIN) 5-325 MG tablet Take 1 tablet by mouth every 4 (four) hours as needed. (Patient not taking: Reported on 06/11/2018) 15 tablet 0   ibuprofen (  ADVIL,MOTRIN) 600 MG tablet Take 1 tablet (600 mg total) by mouth 4 (four) times daily. (Patient not taking: Reported on 07/02/2018) 30 tablet 0   levofloxacin (LEVAQUIN) 750 MG tablet Take 1 tablet (750 mg total) by mouth daily. Take 1 hour prior to your prostate biopsy procedure. 1 tablet 0   naproxen (NAPROSYN) 500 MG tablet Take 500 mg by mouth as needed.      ramipril (ALTACE) 5 MG capsule TAKE ONE CAPSULE BY MOUTH ONCE  DAILY (Patient not taking: No sig reported) 90 capsule 3   No facility-administered medications prior to visit.    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   HEENT : Oropharynx  clear / edentulous  Nasal turbinates nl    NECK :  without  apparent JVD/ palpable Nodes/TM    LUNGS: no acc muscle use,  Min barrel  contour chest wall with bilateral  slightly decreased bs s audible wheeze and  without cough on insp or exp maneuvers and min  Hyperresonant  to  percussion bilaterally    CV:  RRR  no s3 or murmur or increase in P2, and no edema   ABD:  soft and nontender with pos end  insp Hoover's  in the supine position.  No bruits or organomegaly appreciated   MS:  Nl gait/ ext warm without deformities Or obvious joint restrictions  calf tenderness, cyanosis or clubbing     SKIN: warm and dry without lesions    NEURO:  alert, approp, nl sensorium with  no motor or cerebellar deficits apparent.            Assessment   Solitary pulmonary nodule on lung CT Active smoker  - CT chest 06/24/23   17.9 mm  LLL post basal segment  - PET 10/01/2023    I agree with radiology high risk Ca ? If still resectable for cure or operable (would need LLobectomy) so start with PET for staging and PFTs for eval operability   Discussed in detail all the  indications, usual  risks and alternatives  relative to the  benefits with patient who agrees to proceed with w/u as outlined.          Each maintenance medication was reviewed in detail including emphasizing most importantly the difference between maintenance and prns and under what circumstances the prns are to be triggered using an action plan format where appropriate.  Total time for H and P, chart review, counseling,  and generating customized AVS unique to this office visit / same day charting  = 32 min new pt eval            Cigarette smoker 4-5 min discussion re active cigarette smoking in addition to office  E&M  Ask about tobacco use:  ongoing despite dx of possible lung ca Advise quitting   I took an extended  opportunity with this patient to outline the consequences of continued cigarette use  in airway disorders based on all the data we have from the multiple national lung health studies (perfomed over decades at millions of dollars in cost)  indicating that smoking cessation, not choice of inhalers or pulmonary physicians, is the most important aspect of his care.   Assess willingness:  Not committed at this point Assist in quit attempt:  Per PCP when ready Arrange follow up:   Follow up per Primary Care planned                   Sandrea Hughs, MD 10/01/2023

## 2023-10-01 ENCOUNTER — Encounter: Payer: Self-pay | Admitting: Internal Medicine

## 2023-10-01 ENCOUNTER — Ambulatory Visit (INDEPENDENT_AMBULATORY_CARE_PROVIDER_SITE_OTHER): Payer: 59 | Admitting: Internal Medicine

## 2023-10-01 VITALS — BP 176/79 | HR 68 | Ht 64.0 in | Wt 104.2 lb

## 2023-10-01 DIAGNOSIS — R911 Solitary pulmonary nodule: Secondary | ICD-10-CM | POA: Diagnosis not present

## 2023-10-01 DIAGNOSIS — F17210 Nicotine dependence, cigarettes, uncomplicated: Secondary | ICD-10-CM

## 2023-10-01 NOTE — Assessment & Plan Note (Signed)
 4-5 min discussion re active cigarette smoking in addition to office E&M  Ask about tobacco use:  ongoing despite dx of possible lung ca Advise quitting   I took an extended  opportunity with this patient to outline the consequences of continued cigarette use  in airway disorders based on all the data we have from the multiple national lung health studies (perfomed over decades at millions of dollars in cost)  indicating that smoking cessation, not choice of inhalers or pulmonary physicians, is the most important aspect of his care.   Assess willingness:  Not committed at this point Assist in quit attempt:  Per PCP when ready Arrange follow up:   Follow up per Primary Care planned

## 2023-10-01 NOTE — Patient Instructions (Addendum)
 My office will be contacting you by phone for referral to PET scan and PFTs at [facility]  - if you don't hear back from my office within one week please call us back or notify us thru MyChart and we'll address it right away.   I will call you with the next step - either do a biopsy or remove it depending on the above results   The key is to stop smoking completely before smoking completely stops you!

## 2023-10-01 NOTE — Assessment & Plan Note (Addendum)
 Active smoker  - CT chest 06/24/23   17.9 mm  LLL post basal segment  - PET 10/01/2023    I agree with radiology high risk Ca ? If still resectable for cure or operable (would need LLobectomy) so start with PET for staging and PFTs for eval operability   Discussed in detail all the  indications, usual  risks and alternatives  relative to the benefits with patient who agrees to proceed with w/u as outlined.          Each maintenance medication was reviewed in detail including emphasizing most importantly the difference between maintenance and prns and under what circumstances the prns are to be triggered using an action plan format where appropriate.  Total time for H and P, chart review, counseling,  and generating customized AVS unique to this office visit / same day charting  = 32 min new pt eval

## 2023-10-02 ENCOUNTER — Ambulatory Visit: Payer: 59 | Admitting: Urology

## 2023-10-02 DIAGNOSIS — R972 Elevated prostate specific antigen [PSA]: Secondary | ICD-10-CM

## 2023-10-08 ENCOUNTER — Encounter (HOSPITAL_COMMUNITY)
Admission: RE | Admit: 2023-10-08 | Discharge: 2023-10-08 | Disposition: A | Discharge status: Home / Self Care | Source: Ambulatory Visit | Attending: Internal Medicine | Admitting: Internal Medicine

## 2023-10-08 ENCOUNTER — Encounter (HOSPITAL_COMMUNITY): Payer: Self-pay

## 2023-10-08 DIAGNOSIS — R911 Solitary pulmonary nodule: Secondary | ICD-10-CM | POA: Insufficient documentation

## 2023-10-19 ENCOUNTER — Ambulatory Visit: Admitting: Urology

## 2023-10-30 ENCOUNTER — Telehealth: Payer: Self-pay | Admitting: Internal Medicine

## 2023-10-30 NOTE — Telephone Encounter (Signed)
 Spoke with patient to discuss scheduling the PFT ordered by Dr. Tama Headings suggested I ask the [facility] schedler to call and speak with him directly for scheduling----he said that might be best---Spoke with Okey Regal in [facility] and she stated she will call the patient---

## 2023-12-15 ENCOUNTER — Encounter (HOSPITAL_COMMUNITY)

## 2023-12-15 ENCOUNTER — Ambulatory Visit (HOSPITAL_COMMUNITY)
Admission: RE | Admit: 2023-12-15 | Discharge: 2023-12-15 | Disposition: A | Discharge status: Home / Self Care | Source: Ambulatory Visit | Attending: Internal Medicine | Admitting: Internal Medicine

## 2023-12-15 DIAGNOSIS — J984 Other disorders of lung: Secondary | ICD-10-CM | POA: Insufficient documentation

## 2023-12-15 DIAGNOSIS — R911 Solitary pulmonary nodule: Secondary | ICD-10-CM | POA: Diagnosis present

## 2023-12-15 DIAGNOSIS — Z87891 Personal history of nicotine dependence: Secondary | ICD-10-CM | POA: Diagnosis not present

## 2023-12-15 LAB — PULMONARY FUNCTION TEST
DL/VA % pred: 71 %
DL/VA: 2.99 ml/min/mmHg/L
DLCO unc % pred: 63 %
DLCO unc: 13.72 ml/min/mmHg
FEF 25-75 Post: 1.17 L/s
FEF 25-75 Pre: 1.74 L/s
FEF2575-%Change-Post: -32 %
FEF2575-%Pred-Post: 56 %
FEF2575-%Pred-Pre: 83 %
FEV1-%Change-Post: -5 %
FEV1-%Pred-Post: 83 %
FEV1-%Pred-Pre: 88 %
FEV1-Post: 2.2 L
FEV1-Pre: 2.33 L
FEV1FVC-%Change-Post: 4 %
FEV1FVC-%Pred-Pre: 101 %
FEV6-%Change-Post: -9 %
FEV6-%Pred-Post: 82 %
FEV6-%Pred-Pre: 91 %
FEV6-Post: 2.77 L
FEV6-Pre: 3.06 L
FEV6FVC-%Change-Post: 0 %
FEV6FVC-%Pred-Post: 104 %
FEV6FVC-%Pred-Pre: 104 %
FVC-%Change-Post: -9 %
FVC-%Pred-Post: 78 %
FVC-%Pred-Pre: 87 %
FVC-Post: 2.81 L
FVC-Pre: 3.11 L
Post FEV1/FVC ratio: 79 %
Post FEV6/FVC ratio: 99 %
Pre FEV1/FVC ratio: 75 %
Pre FEV6/FVC Ratio: 98 %
RV % pred: 87 %
RV: 1.79 L
TLC % pred: 85 %
TLC: 4.95 L

## 2023-12-15 MED ORDER — ALBUTEROL SULFATE (2.5 MG/3ML) 0.083% IN NEBU
2.5000 mg | INHALATION_SOLUTION | Freq: Once | RESPIRATORY_TRACT | Status: AC
  Administered 2023-12-15: 2.5 mg via RESPIRATORY_TRACT

## 2023-12-21 ENCOUNTER — Ambulatory Visit: Payer: Self-pay | Admitting: Internal Medicine

## 2023-12-23 ENCOUNTER — Telehealth: Payer: Self-pay | Admitting: Internal Medicine

## 2023-12-23 NOTE — Telephone Encounter (Signed)
 Spoke with patient regarding the Thursday 12/31/23 11:00 am PET scan appointment at [facility] time is 10:30 am  for check in---[facility] staff will call patient day before to go over instructions.  He voiced his understanding

## 2023-12-31 ENCOUNTER — Ambulatory Visit (HOSPITAL_COMMUNITY)
Admission: RE | Admit: 2023-12-31 | Discharge: 2023-12-31 | Disposition: A | Discharge status: Home / Self Care | Source: Ambulatory Visit | Attending: Internal Medicine | Admitting: Internal Medicine

## 2023-12-31 DIAGNOSIS — R9389 Abnormal findings on diagnostic imaging of other specified body structures: Secondary | ICD-10-CM | POA: Diagnosis not present

## 2023-12-31 DIAGNOSIS — R911 Solitary pulmonary nodule: Secondary | ICD-10-CM | POA: Diagnosis present

## 2023-12-31 DIAGNOSIS — R918 Other nonspecific abnormal finding of lung field: Secondary | ICD-10-CM | POA: Diagnosis not present

## 2023-12-31 DIAGNOSIS — R9341 Abnormal radiologic findings on diagnostic imaging of renal pelvis, ureter, or bladder: Secondary | ICD-10-CM | POA: Insufficient documentation

## 2023-12-31 MED ORDER — FLUDEOXYGLUCOSE F - 18 (FDG) INJECTION
5.3400 | Freq: Once | INTRAVENOUS | Status: AC | PRN
  Administered 2023-12-31: 5.34 via INTRAVENOUS

## 2024-01-07 NOTE — Progress Notes (Signed)
 Attempted to contact patient regarding results. No VM

## 2024-01-08 NOTE — Progress Notes (Signed)
 Attempted to contact patient regarding results. NO VM

## 2024-02-09 ENCOUNTER — Encounter (HOSPITAL_COMMUNITY): Payer: Self-pay

## 2024-02-09 ENCOUNTER — Emergency Department (HOSPITAL_COMMUNITY)
Admission: EM | Admit: 2024-02-09 | Discharge: 2024-02-10 | Disposition: A | Discharge status: Home / Self Care | Attending: Emergency Medicine | Admitting: Emergency Medicine

## 2024-02-09 ENCOUNTER — Other Ambulatory Visit: Payer: Self-pay

## 2024-02-09 DIAGNOSIS — F10930 Alcohol use, unspecified with withdrawal, uncomplicated: Secondary | ICD-10-CM

## 2024-02-09 DIAGNOSIS — F10239 Alcohol dependence with withdrawal, unspecified: Secondary | ICD-10-CM | POA: Insufficient documentation

## 2024-02-09 DIAGNOSIS — I10 Essential (primary) hypertension: Secondary | ICD-10-CM | POA: Diagnosis not present

## 2024-02-09 DIAGNOSIS — R251 Tremor, unspecified: Secondary | ICD-10-CM | POA: Diagnosis not present

## 2024-02-09 DIAGNOSIS — R42 Dizziness and giddiness: Secondary | ICD-10-CM | POA: Diagnosis present

## 2024-02-09 DIAGNOSIS — Z79899 Other long term (current) drug therapy: Secondary | ICD-10-CM | POA: Diagnosis not present

## 2024-02-09 LAB — CBC WITH DIFFERENTIAL/PLATELET
Abs Immature Granulocytes: 0.09 K/uL — ABNORMAL HIGH (ref 0.00–0.07)
Basophils Absolute: 0.1 K/uL (ref 0.0–0.1)
Basophils Relative: 1 %
Eosinophils Absolute: 0.1 K/uL (ref 0.0–0.5)
Eosinophils Relative: 2 %
HCT: 46.7 % (ref 39.0–52.0)
Hemoglobin: 16.4 g/dL (ref 13.0–17.0)
Immature Granulocytes: 1 %
Lymphocytes Relative: 39 %
Lymphs Abs: 2.6 K/uL (ref 0.7–4.0)
MCH: 35.8 pg — ABNORMAL HIGH (ref 26.0–34.0)
MCHC: 35.1 g/dL (ref 30.0–36.0)
MCV: 102 fL — ABNORMAL HIGH (ref 80.0–100.0)
Monocytes Absolute: 1 K/uL (ref 0.1–1.0)
Monocytes Relative: 15 %
Neutro Abs: 2.7 K/uL (ref 1.7–7.7)
Neutrophils Relative %: 42 %
Platelets: 151 K/uL (ref 150–400)
RBC: 4.58 MIL/uL (ref 4.22–5.81)
RDW: 13 % (ref 11.5–15.5)
WBC: 6.5 K/uL (ref 4.0–10.5)
nRBC: 0 % (ref 0.0–0.2)

## 2024-02-09 LAB — COMPREHENSIVE METABOLIC PANEL WITH GFR
ALT: 28 U/L (ref 0–44)
AST: 43 U/L — ABNORMAL HIGH (ref 15–41)
Albumin: 3 g/dL — ABNORMAL LOW (ref 3.5–5.0)
Alkaline Phosphatase: 126 U/L (ref 38–126)
Anion gap: 9 (ref 5–15)
BUN: 9 mg/dL (ref 8–23)
CO2: 26 mmol/L (ref 22–32)
Calcium: 8.9 mg/dL (ref 8.9–10.3)
Chloride: 101 mmol/L (ref 98–111)
Creatinine, Ser: 0.69 mg/dL (ref 0.61–1.24)
GFR, Estimated: >60 mL/min (ref >60)
Glucose, Bld: 74 mg/dL (ref 70–99)
Potassium: 3.6 mmol/L (ref 3.5–5.1)
Sodium: 136 mmol/L (ref 135–145)
Total Bilirubin: 1.5 mg/dL — ABNORMAL HIGH (ref 0.0–1.2)
Total Protein: 7.2 g/dL (ref 6.5–8.1)

## 2024-02-09 LAB — ETHANOL: Alcohol, Ethyl (B): <15 mg/dL (ref <15)

## 2024-02-09 MED ORDER — SODIUM CHLORIDE 0.9 % IV BOLUS
1000.0000 mL | Freq: Once | INTRAVENOUS | Status: AC
  Administered 2024-02-09: 1000 mL via INTRAVENOUS

## 2024-02-09 MED ORDER — THIAMINE HCL 100 MG/ML IJ SOLN: 100.0000 mg | Freq: Every day | INTRAMUSCULAR | Status: DC

## 2024-02-09 MED ORDER — LORAZEPAM 1 MG PO TABS: 0.0000 mg | ORAL_TABLET | Freq: Four times a day (QID) | ORAL | Status: DC

## 2024-02-09 MED ORDER — ACETAMINOPHEN 325 MG PO TABS
650.0000 mg | ORAL_TABLET | ORAL | Status: DC | PRN
  Administered 2024-02-09: 650 mg via ORAL
  Filled 2024-02-09: qty 2

## 2024-02-09 MED ORDER — LORAZEPAM 1 MG PO TABS: 1.0000 mg | ORAL_TABLET | Freq: Four times a day (QID) | ORAL | Status: DC

## 2024-02-09 MED ORDER — LORAZEPAM 1 MG PO TABS: 0.0000 mg | ORAL_TABLET | Freq: Two times a day (BID) | ORAL | Status: DC

## 2024-02-09 MED ORDER — THIAMINE MONONITRATE 100 MG PO TABS: 100.0000 mg | ORAL_TABLET | Freq: Every day | ORAL | Status: DC

## 2024-02-09 MED ORDER — LORAZEPAM 1 MG PO TABS: 1.0000 mg | ORAL_TABLET | Freq: Two times a day (BID) | ORAL | Status: DC

## 2024-02-09 NOTE — ED Provider Notes (Signed)
 [place] EMERGENCY DEPARTMENT AT [facility] Provider Note   CSN: [id number] Arrival date & time: 02/09/24  2109     Patient presents with: Dizziness and Shaking   Christopher Austin is a 68 y.o. male.  {Add pertinent medical, surgical, social history, OB history to HPI:32947} HPI     This is a 68 year old male who presents with dizziness, lightheadedness, headache.  Patient reports that he attempted to stop drinking 2 days ago.  He has never tried to stop on his own.  He normally drinks from when I wake up to when I go to bed.  He states he drinks beer and liquor.  He has only had 3 beers today.  Reports that his hands are cramping and that he feels dizzy and has a headache.  No history of alcohol withdrawal seizures.  Prior to Admission medications   Medication Sig Start Date End Date Taking? Authorizing Provider  amLODipine  (NORVASC ) 5 MG tablet Take 5 mg by mouth daily.    [provider]  mirtazapine (REMERON) 15 MG tablet Take 15 mg by mouth daily. 07/15/23   [provider]  omeprazole (PRILOSEC) 20 MG capsule Take 20 mg by mouth daily. 04/22/23   [provider]    Allergies: Patient has no known allergies.    Review of Systems  Constitutional:  Negative for fever.  Respiratory:  Negative for shortness of breath.   Cardiovascular:  Negative for chest pain.  Gastrointestinal:  Negative for abdominal pain.  Neurological:  Positive for dizziness and headaches.  All other systems reviewed and are negative.   Updated Vital Signs BP 122/82   Pulse 66   Temp 98.6 F (37 C) (Oral)   Resp 18   SpO2 94%   Physical Exam Vitals and nursing note reviewed.  Constitutional:      Appearance: He is well-developed.  HENT:     Head: Normocephalic and atraumatic.     Mouth/Throat:     Mouth: Mucous membranes are dry.  Eyes:     Pupils: Pupils are equal, round, and reactive to light.  Cardiovascular:     Rate and Rhythm: Normal rate  and regular rhythm.     Heart sounds: Normal heart sounds. No murmur heard. Pulmonary:     Effort: Pulmonary effort is normal. No respiratory distress.     Breath sounds: Normal breath sounds. No wheezing.  Abdominal:     General: Bowel sounds are normal.     Palpations: Abdomen is soft.     Tenderness: There is no abdominal tenderness. There is no rebound.  Musculoskeletal:     Cervical back: Neck supple.     Comments: Slight tremor  Lymphadenopathy:     Cervical: No cervical adenopathy.  Skin:    General: Skin is warm and dry.  Neurological:     Mental Status: He is alert and oriented to person, place, and time.  Psychiatric:        Mood and Affect: Mood normal.     (all labs ordered are listed, but only abnormal results are displayed) Labs Reviewed  CBC WITH DIFFERENTIAL/PLATELET  COMPREHENSIVE METABOLIC PANEL WITH GFR  ETHANOL  CK    EKG: None  Radiology: No results found.  {Document cardiac monitor, telemetry assessment procedure when appropriate:32947} Procedures   Medications Ordered in the ED  LORazepam  (ATIVAN ) tablet 1 mg (has no administration in time range)    Or  LORazepam  (ATIVAN ) tablet 0-4 mg (has no administration in time range)  LORazepam  (ATIVAN ) tablet 1 mg (has no administration in time range)    Or  LORazepam  (ATIVAN ) tablet 0-4 mg (has no administration in time range)  thiamine  (VITAMIN B1) tablet 100 mg (has no administration in time range)    Or  thiamine  (VITAMIN B1) injection 100 mg (has no administration in time range)  acetaminophen  (TYLENOL ) tablet 650 mg (has no administration in time range)  sodium chloride  0.9 % bolus 1,000 mL (has no administration in time range)      {Click here for ABCD2, HEART and other calculators REFRESH Note before signing:1}                              Medical Decision Making Amount and/or Complexity of Data Reviewed Labs: ordered.  Risk OTC drugs. Prescription drug  management.   ***  {Document critical care time when appropriate  Document review of labs and clinical decision tools ie CHADS2VASC2, etc  Document your independent review of radiology images and any outside records  Document your discussion with family members, caretakers and with consultants  Document social determinants of health affecting pt's care  Document your decision making why or why not admission, treatments were needed:32947:::1}   Final diagnoses:  None    ED Discharge Orders     None

## 2024-02-09 NOTE — ED Triage Notes (Signed)
 Pt states that he feels dizzy, headache, lightheaded and is having cramps in his hands that started yesterday.   Pt states that is typically drink everyday and he has been trying to cutback over the last two days and has only had 3 beers today when normally he drinks all day.

## 2024-02-10 DIAGNOSIS — F10239 Alcohol dependence with withdrawal, unspecified: Secondary | ICD-10-CM | POA: Diagnosis not present

## 2024-02-10 LAB — CK: Total CK: 361 U/L (ref 49–397)

## 2024-02-10 MED ORDER — CHLORDIAZEPOXIDE HCL 25 MG PO CAPS: ORAL_CAPSULE | ORAL | 0 refills | Status: DC

## 2024-02-10 MED ORDER — LORAZEPAM 1 MG PO TABS
1.0000 mg | ORAL_TABLET | Freq: Once | ORAL | Status: AC
  Administered 2024-02-10: 1 mg via ORAL
  Filled 2024-02-10: qty 1

## 2024-02-10 NOTE — ED Notes (Signed)
..  The patient is A&OX4, ambulatory at d/c with independent steady gait, NAD. Pt verbalized understanding of d/c instructions, prescription and follow up care. He reports his daughter will be driving him home.

## 2024-02-10 NOTE — Discharge Instructions (Signed)
 You were seen today with symptoms likely related to mild alcohol withdrawal.  Take Librium  as this will help with withdrawal symptoms.  You should be under the supervision of your primary physician.  See resources provided for outpatient rehab.

## 2024-03-04 ENCOUNTER — Ambulatory Visit: Admitting: Urology

## 2024-03-07 ENCOUNTER — Telehealth: Payer: Self-pay

## 2024-03-07 NOTE — Telephone Encounter (Signed)
 Pt called to confirm rescheduled appt for 09/19

## 2024-03-13 NOTE — Progress Notes (Deleted)
 Christopher Austin, male    DOB: Aug 07, 1955    MRN: [id number]   Brief patient profile:  34  yobm active smoker  referred to pulmonary clinic in [place]  10/01/2023 by [facility] clinic for abn LDSCT / SPN LLL  LDSCT  06/24/23  - 17.9 mm left lower lobe pulmonary nodule, highly suspicious for primary bronchogenic carcinoma. 2. No thoracic adenopathy 3. Esophageal air fluid level suggests dysmotility or gastroesophageal reflux. 4. Aortic atherosclerosis (ICD10-I70.0), coronary artery atherosclerosis and emphysema =Moderate centrilobular emphysema.      History of Present Illness  10/01/2023  Pulmonary/ 1st office eval/ [name] / [place] [facility] on ACEi  Chief Complaint  Patient presents with   Consult    Abnormal chest scan    Dyspnea:  limited by leg pain not breathing Ok with yardwork but usually riding mower Cough: some in am yellowish  Sleep: flat bed/ one pillow no proble SABA use: not using any 02: none  Rec My office will be contacting you by phone for referral to PET scan and PFT[facility] at [facility]  I will call you with the next step - either do a biopsy or remove it depending on the above results The key is to stop smoking completely before smoking completely stops you!   03/15/2024  f/u ov/[place] office/[name] re: *** maint on ***  No chief complaint on file.   Dyspnea:  *** Cough: *** Sleeping: ***   resp cc  SABA use: *** 02: ***  Lung cancer screening: ***   No obvious day to day or daytime variability or assoc excess/ purulent sputum or mucus plugs or hemoptysis or cp or chest tightness, subjective wheeze or overt sinus or hb symptoms.    Also denies any obvious fluctuation of symptoms with weather or environmental changes or other aggravating or alleviating factors except as outlined above   No unusual exposure hx or h/o childhood pna/ asthma or knowledge of premature birth.  Current Allergies, Complete Past Medical History, Past Surgical  History, Family History, and Social History were reviewed in [employer] record.  ROS  The following are not active complaints unless bolded Hoarseness, sore throat, dysphagia, dental problems, itching, sneezing,  nasal congestion or discharge of excess mucus or purulent secretions, ear ache,   fever, chills, sweats, unintended wt loss or wt gain, classically pleuritic or exertional cp,  orthopnea pnd or arm/hand swelling  or leg swelling, presyncope, palpitations, abdominal pain, anorexia, nausea, vomiting, diarrhea  or change in bowel habits or change in bladder habits, change in stools or change in urine, dysuria, hematuria,  rash, arthralgias, visual complaints, headache, numbness, weakness or ataxia or problems with walking or coordination,  change in mood or  memory.        No outpatient medications have been marked as taking for the 03/15/24 encounter (Appointment) with [name], MD.             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   Chronic hepatitis C (HCC)    Chronic pain    Essential hypertension, benign    Headache(784.0)    History of alcohol abuse    History of seizures    none since the 1990s   PSVT (paroxysmal supraventricular tachycardia) (HCC)    Noted during exercise testing      Objective:    Wt Readings from  Last 3 Encounters:  10/01/23 104 lb 3.2 oz (47.3 kg)  01/28/23 98 lb (44.5 kg)  04/30/22 79 lb 5.9 oz (36 kg)      Vital signs reviewed  03/15/2024  - Note at rest 02 sats  ***% on ***   General appearance:    ***      Vital signs reviewed  03/15/2024  - Note at rest 02 sats  ***% on ***   General appearance:    ***   Min barr***      Assessment

## 2024-03-15 ENCOUNTER — Encounter: Payer: Self-pay | Admitting: Internal Medicine

## 2024-03-15 ENCOUNTER — Ambulatory Visit: Admitting: Internal Medicine

## 2024-03-18 ENCOUNTER — Ambulatory Visit (INDEPENDENT_AMBULATORY_CARE_PROVIDER_SITE_OTHER): Admitting: Internal Medicine

## 2024-03-18 ENCOUNTER — Encounter: Payer: Self-pay | Admitting: Internal Medicine

## 2024-03-18 VITALS — BP 135/76 | HR 43 | Ht 64.0 in | Wt 112.2 lb

## 2024-03-18 DIAGNOSIS — F17210 Nicotine dependence, cigarettes, uncomplicated: Secondary | ICD-10-CM

## 2024-03-18 DIAGNOSIS — R911 Solitary pulmonary nodule: Secondary | ICD-10-CM | POA: Diagnosis not present

## 2024-03-18 NOTE — Progress Notes (Signed)
 Christopher Austin, male    DOB: 1956-01-08    MRN: [id number]   Brief patient profile:  9  yobm active smoker  referred to pulmonary clinic in [place]  10/01/2023 by [facility] clinic for abn LDSCT / SPN LLL  LDSCT  06/24/23  - 17.9 mm left lower lobe pulmonary nodule, highly suspicious for primary bronchogenic carcinoma. 2. No thoracic adenopathy 3. Esophageal air fluid level suggests dysmotility or gastroesophageal reflux. 4. Aortic atherosclerosis (ICD10-I70.0), coronary artery atherosclerosis and emphysema =Moderate centrilobular emphysema.      History of Present Illness  10/01/2023  Pulmonary/ 1st office eval/ Graceyn Fodor / [place] [facility] on ACEi  Chief Complaint  Patient presents with   Consult    Abnormal chest scan    Dyspnea:  limited by leg pain not breathing Ok with yardwork but usually riding mower Cough: some in am yellowish  Sleep: flat bed/ one pillow no proble SABA use: not using any 02: none  Rec The key is to stop smoking completely before smoking completely stops you!   PFTs 10/01/23  nl x dlco    03/18/2024  f/u ov/[place] office/Florence Yeung re: copd gold G0 maint on no resp rx  /still smoking but cutting down Chief Complaint  Patient presents with   Pulmonary nodule    Discuss PET scan   Dyspnea:  Not limited by breathing from desired activities   Cough: none  Sleeping: flat bed, one pillow, noresp cc  SABA use: none 02: none     No obvious day to day or daytime variability or assoc excess/ purulent sputum or mucus plugs or hemoptysis or cp or chest tightness, subjective wheeze or overt sinus or hb symptoms.    Also denies any obvious fluctuation of symptoms with weather or environmental changes or other aggravating or alleviating factors except as outlined above   No unusual exposure hx or h/o childhood pna/ asthma or knowledge of premature birth.  Current Allergies, Complete Past Medical History, Past Surgical History, Family History, and Social  History were reviewed in [employer] record.  ROS  The following are not active complaints unless bolded Hoarseness, sore throat, dysphagia, dental problems, itching, sneezing,  nasal congestion or discharge of excess mucus or purulent secretions, ear ache,   fever, chills, sweats, unintended wt loss or wt gain, classically pleuritic or exertional cp,  orthopnea pnd or arm/hand swelling  or leg swelling, presyncope, palpitations, abdominal pain, anorexia, nausea, vomiting, diarrhea  or change in bowel habits or change in bladder habits, change in stools or change in urine, dysuria, hematuria,  rash, arthralgias, visual complaints, headache, numbness, weakness or ataxia or problems with walking or coordination,  change in mood or  memory.        Current Meds  Medication Sig   amLODipine  (NORVASC ) 5 MG tablet Take 5 mg by mouth daily.   chlordiazePOXIDE  (LIBRIUM ) 25 MG capsule 50mg  PO TID x 1D, then 25-50mg  PO BID X 1D, then 25-50mg  PO QD X 1D   mirtazapine (REMERON) 15 MG tablet Take 15 mg by mouth daily.   omeprazole (PRILOSEC) 20 MG capsule Take 20 mg by mouth daily.             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   Chronic hepatitis C (HCC)    Chronic pain    Essential hypertension, benign    Headache(784.0)  History of alcohol abuse    History of seizures    none since the 1990s   PSVT (paroxysmal supraventricular tachycardia) (HCC)    Noted during exercise testing      Objective:    Wt Readings from Last 3 Encounters:  03/18/24 112 lb 3.2 oz (50.9 kg)  10/01/23 104 lb 3.2 oz (47.3 kg)  01/28/23 98 lb (44.5 kg)    Vital signs reviewed  03/18/2024  - Note at rest 02 sats  97% on RA    General appearance:    amb elderly bm nad   HEENT : Oropharynx  clear   Nasal turbinates nl    NECK :  without  apparent JVD/ palpable Nodes/TM     LUNGS: no acc muscle use,  Min barrel  contour chest wall with bilateral  slightly decreased bs s audible wheeze and  without cough on insp or exp maneuvers and min  Hyperresonant  to  percussion bilaterally    CV:  RRR  no s3 or murmur or increase in P2, and no edema   ABD:  soft and nontender with pos end  insp Hoover's  in the supine position.  No bruits or organomegaly appreciated   MS:  Nl gait/ ext warm without deformities Or obvious joint restrictions  calf tenderness, cyanosis or clubbing     SKIN: warm and dry without lesions    NEURO:  alert, approp, nl sensorium with  no motor or cerebellar deficits apparent.         Assessment     Assessment & Plan Solitary pulmonary nodule on lung CT Active smoker  - CT chest 06/24/23   17.9 mm  LLL  - PFT's  12/15/23  FEV1 2.33 (88 % ) ratio 0.75  p 0 % improvement from saba p 0 prior to study with DLCO  13.72 (63%)   and FV curve flat insp portion with min concavity on exp curve   - PET 12/31/23   ?  Vascular lesion rec CTa/ also Pos Prostate nodule > refer to urology  - CTa ordered 03/18/2024   Unclear as to etiology of LL nodule but will complete the work up with CTa as rec and then refer back to LCS program/ see below  No regular pulmonary clinic f/u needed here - can refer to nodule clinic In [place] if needed or T surgery (would be candidate for surgical excision if indicted)    Cigarette smoker Counseled re importance of smoking cessation but did not meet time criteria for separate billing    Low-dose CT lung cancer screening is recommended for patients who are 30-32 years of age with a 20+ pack-year history of smoking and who are currently smoking or quit <=15 years ago. No coughing up blood  No unintentional weight loss of > 15 pounds in the last 6 months - pt is eligible for scanning yearly until age 37 > will refer back p finish eval of LLL vascular nodule.          Each maintenance medication was reviewed in detail  including emphasizing most importantly the difference between maintenance and prns and under what circumstances the prns are to be triggered using an action plan format where appropriate.  Total time for H and P, chart review, counseling,  and generating customized AVS unique to this office visit / same day charting = 34 min summary final f/u ov            Patient Instructions  My office will be contacting you by phone for referral to [facility] for CTangiogram  - if you don't hear back from my office within one week please call us  back or notify us  thru MyChart and we'll address it right away.   If this test is negative, I will refer you to the Lung cancer screening program for yearly followup until you turn 80   In meantime you don't have COPD and no breathing medicine needed, just clean air (no smoking!)   Pulmonary follow up in this clinic is needed    Ozell America, MD 03/18/2024

## 2024-03-18 NOTE — Assessment & Plan Note (Addendum)
 Active smoker  - CT chest 06/24/23   17.9 mm  LLL  - PFT's  12/15/23  FEV1 2.33 (88 % ) ratio 0.75  p 0 % improvement from saba p 0 prior to study with DLCO  13.72 (63%)   and FV curve flat insp portion with min concavity on exp curve   - PET 12/31/23   ?  Vascular lesion rec CTa/ also Pos Prostate nodule > refer to urology  - CTa ordered 03/18/2024   Unclear as to etiology of LL nodule but will complete the work up with CTa as rec and then refer back to LCS program/ see below  No regular pulmonary clinic f/u needed here - can refer to nodule clinic In [place] if needed or T surgery (would be candidate for surgical excision if indicted)

## 2024-03-18 NOTE — Assessment & Plan Note (Addendum)
 Counseled re importance of smoking cessation but did not meet time criteria for separate billing    Low-dose CT lung cancer screening is recommended for patients who are 43-67 years of age with a 20+ pack-year history of smoking and who are currently smoking or quit <=15 years ago. No coughing up blood  No unintentional weight loss of > 15 pounds in the last 6 months - pt is eligible for scanning yearly until age 44 > will refer back p finish eval of LLL vascular nodule.          Each maintenance medication was reviewed in detail including emphasizing most importantly the difference between maintenance and prns and under what circumstances the prns are to be triggered using an action plan format where appropriate.  Total time for H and P, chart review, counseling,  and generating customized AVS unique to this office visit / same day charting = 34 min summary final f/u ov

## 2024-03-18 NOTE — Patient Instructions (Signed)
 My office will be contacting you by phone for referral to [facility] for CTangiogram  - if you don't hear back from my office within one week please call us  back or notify us  thru MyChart and we'll address it right away.   If this test is negative, I will refer you to the Lung cancer screening program for yearly followup until you turn 80   In meantime you don't have COPD and no breathing medicine needed, just clean air (no smoking!)   Pulmonary follow up in this clinic is needed

## 2024-03-21 ENCOUNTER — Telehealth: Payer: Self-pay | Admitting: Internal Medicine

## 2024-03-21 NOTE — Addendum Note (Signed)
 Addended by: RUFFUS LUKES T on: 03/21/2024 10:44 AM   Modules accepted: Orders

## 2024-03-21 NOTE — Telephone Encounter (Signed)
 Spoke with patient regarding the Friday 04/15/24 8:00 am CTA chest appointment at [facility] time is 7:45 am --1st floor registration desk for check in --patient to come in the week on 03/28/24 for lab work--will mail information to patient and he voiced his understanding

## 2024-03-26 ENCOUNTER — Ambulatory Visit (HOSPITAL_BASED_OUTPATIENT_CLINIC_OR_DEPARTMENT_OTHER)

## 2024-04-15 ENCOUNTER — Encounter: Payer: Self-pay | Admitting: Urology

## 2024-04-15 ENCOUNTER — Ambulatory Visit (INDEPENDENT_AMBULATORY_CARE_PROVIDER_SITE_OTHER): Admitting: Urology

## 2024-04-15 ENCOUNTER — Ambulatory Visit (HOSPITAL_COMMUNITY)
Admission: RE | Admit: 2024-04-15 | Discharge: 2024-04-15 | Disposition: A | Discharge status: Home / Self Care | Source: Ambulatory Visit | Attending: Internal Medicine | Admitting: Internal Medicine

## 2024-04-15 VITALS — BP 120/68 | HR 46

## 2024-04-15 DIAGNOSIS — R972 Elevated prostate specific antigen [PSA]: Secondary | ICD-10-CM | POA: Diagnosis not present

## 2024-04-15 DIAGNOSIS — R911 Solitary pulmonary nodule: Secondary | ICD-10-CM | POA: Insufficient documentation

## 2024-04-15 LAB — URINALYSIS, ROUTINE W REFLEX MICROSCOPIC
Bilirubin, UA: NEGATIVE
Glucose, UA: NEGATIVE
Ketones, UA: NEGATIVE
Leukocytes,UA: NEGATIVE
Nitrite, UA: NEGATIVE
Protein,UA: NEGATIVE
RBC, UA: NEGATIVE
Specific Gravity, UA: 1.01 (ref 1.005–1.030)
Urobilinogen, Ur: 4 mg/dL — ABNORMAL HIGH (ref 0.2–1.0)
pH, UA: 7.5 (ref 5.0–7.5)

## 2024-04-15 MED ORDER — IOHEXOL 350 MG/ML SOLN
75.0000 mL | Freq: Once | INTRAVENOUS | Status: AC | PRN
  Administered 2024-04-15: 75 mL via INTRAVENOUS

## 2024-04-15 NOTE — Patient Instructions (Signed)

## 2024-04-15 NOTE — Progress Notes (Signed)
 04/15/2024 12:45 PM   Christopher Austin [birth year] [id number]  Referring provider: R[facility] [address] [place],  [place] [address]  Elevated PSA   HPI: Christopher Austin is a 68yo here for follouwp for elevated PSA. His last PSA 11/24 was 51.1. No issues urinating. No bone pain. No unexplained weight loss. No significant LUTS.    PMH: 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   Chronic hepatitis C (HCC)    Chronic pain    Essential hypertension, benign    Headache(784.0)    History of alcohol abuse    History of seizures    none since the 1990s   PSVT (paroxysmal supraventricular tachycardia) (HCC)    Noted during exercise testing    Surgical History: Past Surgical History:  Procedure Laterality Date   COLONOSCOPY  05/19/2012   Dr. 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 10 year screening   Right hand surgery  2011   Abscess drainage    Home Medications:  Allergies as of 04/15/2024   No Known Allergies      Medication List        Accurate as of April 15, 2024 12:45 PM. If you have any questions, ask your nurse or doctor.          amLODipine  5 MG tablet Commonly known as: NORVASC  Take 5 mg by mouth daily.   chlordiazePOXIDE  25 MG capsule Commonly known as: LIBRIUM  50mg  PO TID x 1D, then 25-50mg  PO BID X 1D, then 25-50mg  PO QD X 1D   mirtazapine 15 MG tablet Commonly known as: REMERON Take 15 mg by mouth daily.   omeprazole 20 MG capsule Commonly known as: PRILOSEC Take 20 mg by mouth daily.        Allergies: No Known Allergies  Family History: Family History  Problem Relation Age of Onset   Hypertension Mother    Hyperlipidemia Mother    Colon cancer Neg Hx    Colon polyps Neg Hx     Social History:  reports that he has been smoking cigarettes. He started  smoking about 51 years ago. He has a 51.7 pack-year smoking history. He has never used smokeless tobacco. He reports current alcohol use of about 3.0 standard drinks of alcohol per week. He reports that he does not use drugs.  ROS: All other review of systems were reviewed and are negative except what is noted above in HPI  Physical Exam: BP 120/68   Pulse (!) 46 Comment: checked by hand  Constitutional:  Alert and oriented, No acute distress. HEENT: [place] AT, moist mucus membranes.  Trachea midline, no masses. Cardiovascular: No clubbing, cyanosis, or edema. Respiratory: Normal respiratory effort, no increased work of breathing. GI: Abdomen is soft, nontender, nondistended, no abdominal masses GU: No CVA tenderness.  Lymph: No cervical or inguinal lymphadenopathy. Skin: No rashes, bruises or suspicious lesions. Neurologic: Grossly intact, no focal deficits, moving all 4 extremities. Psychiatric: Normal mood and affect.  Laboratory Data: Lab Results  Component Value Date   WBC 6.5 02/09/2024   HGB 16.4 02/09/2024   HCT 46.7 02/09/2024   MCV 102.0 (H) 02/09/2024   PLT 151 02/09/2024    Lab Results  Component Value Date   CREATININE 0.69 02/09/2024    No results found for: PSA  No results found for:  TESTOSTERONE  Lab Results  Component Value Date   HGBA1C 5.3 02/07/2022    Urinalysis    Component Value Date/Time   COLORURINE AMBER (A) 01/28/2023 1343   APPEARANCEUR CLEAR 01/28/2023 1343   LABSPEC 1.016 01/28/2023 1343   PHURINE 6.0 01/28/2023 1343   GLUCOSEU NEGATIVE 01/28/2023 1343   HGBUR NEGATIVE 01/28/2023 1343   BILIRUBINUR NEGATIVE 01/28/2023 1343   KETONESUR NEGATIVE 01/28/2023 1343   PROTEINUR NEGATIVE 01/28/2023 1343   UROBILINOGEN 0.2 05/19/2012 1201   NITRITE NEGATIVE 01/28/2023 1343   LEUKOCYTESUR NEGATIVE 01/28/2023 1343    No results found for: LABMICR, WBCUA, RBCUA, LABEPIT, MUCUS, BACTERIA  Pertinent Imaging:  No results found  for this or any previous visit.  No results found for this or any previous visit.  No results found for this or any previous visit.  No results found for this or any previous visit.  No results found for this or any previous visit.  No results found for this or any previous visit.  No results found for this or any previous visit.  No results found for this or any previous visit.   Assessment & Plan:    1. Elevated PSA (Primary) -PSA today -prostate MRI  - Urinalysis, Routine w reflex microscopic   No follow-ups on file.  Belvie Clara, MD  [facility] [place]

## 2024-04-16 ENCOUNTER — Ambulatory Visit: Payer: Self-pay | Admitting: Internal Medicine

## 2024-04-20 NOTE — Telephone Encounter (Signed)
 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  Left message to review CT results and make office visit with Dr. Catherine.  Informed [place] and forwarded message to her for scheduling referral to Dr. DARIA

## 2024-04-21 NOTE — Telephone Encounter (Signed)
 LVM for patient to call and discuss CTA results with the clinical staff and schedule appointment with Dr. Catherine

## 2024-04-22 ENCOUNTER — Telehealth: Payer: Self-pay | Admitting: *Deleted

## 2024-04-22 NOTE — Telephone Encounter (Signed)
 Called pt's cell- no answer and still unable to leave msg, will call back.

## 2024-04-22 NOTE — Telephone Encounter (Signed)
 Called the pt to explain results/recs. There was no answer and his VM was full so unable to leave msg. Will try back later.

## 2024-04-22 NOTE — Telephone Encounter (Signed)
 Copied from CRM #[id number]. Topic: Clinical - Lab/Test Results >> Apr 21, 2024 10:36 AM Christopher Austin wrote: Reason for CRM: Patient is requesting a call back from Dr. Leisa nurse to discuss his CT results from 9/19  .   duplicate

## 2024-04-26 ENCOUNTER — Encounter (INDEPENDENT_AMBULATORY_CARE_PROVIDER_SITE_OTHER): Payer: Self-pay | Admitting: *Deleted

## 2024-04-26 NOTE — Telephone Encounter (Signed)
 Spoke with patient and he is scheduled to see Dr. Catherine on 05/24/24 will mail information to patient and he voiced his understanding

## 2024-05-22 NOTE — Progress Notes (Signed)
 "  Established Patient Pulmonology Office Visit   Subjective:  Patient ID: Christopher Austin, male    DOB: 07-19-1956  MRN: [id number]  CC:  Chief Complaint  Patient presents with   Follow-up    Abn CT     HPI  Christopher Austin is a 68 y/o M with a PMH significant for MDD, HTN, and GERD who is here for evaluation of LLL solitary pulmonary nodule.  Discussed the use of AI scribe software for clinical note transcription with the patient, who gave verbal consent to proceed.  History of Present Illness Christopher Austin is a 68 year old male who presents for evaluation of a left lower lobe lung nodule.  A left lower lobe lung nodule measuring 1.7 by 1.7 centimeters was identified during a lung cancer screening CT scan in November 2024. A subsequent PET scan showed slight uptake in the area. He has no symptoms such as hemoptysis, wheezing, or significant dyspnea, although he experiences mild shortness of breath with exertion. No fevers, chills, or night sweats are present.  He has experienced unintentional weight loss, with his weight fluctuating from his usual 120 pounds to as low as 112 pounds without trying to lose weight. His appetite is reduced, and he does not consume full meals regularly.  His past medical history includes hypertension and depression. He takes amlodipine , mirtazapine, naproxen  for pain, and a medication for reflux. He also takes naproxen  for pain and a medication for reflux. He has a history of a right knee problem following an accident, which causes occasional instability.  He has a significant smoking history, having smoked since age 69, with a previous consumption of two packs per day, now reduced to half a pack per day. He has been exposed to a wood-burning stove for heating for the past two years. He worked in holiday representative, primarily with sheetrock, and denies significant asbestos exposure.  Family history is notable for a brother who passed away from stomach cancer  approximately eight to nine months ago. There is no family history of lung cancer.  Symptoms Associated with Lung cancer:   Central Tumor Sx: Dyspnea  Peripheral Tumor Sx: Dyspnea  Sx of Metastasis: Weight loss   Hx of Anesthesia reactions: none  PMH:  - HTN  Important Medications: n/a  Allergies: NKA  Social History:  Smoking and [employer] Exposure: Tobacco Smoking 100 PY  No occupational or military exposures  Family History: History of other types of cancers: gastric in brother  ASA grade:  ASA 2 - Patient with mild systemic disease with no functional limitations  Karnofsky Performance Status: 90 Able to carry on normal activity, minor signs, or symptoms of disease  ECOG Performance Status: (0) Fully active, able to carry on all predisease performance without restriction  ROS    Current Outpatient Medications:    amLODipine  (NORVASC ) 5 MG tablet, Take 5 mg by mouth daily., Disp: , Rfl:    chlordiazePOXIDE  (LIBRIUM ) 25 MG capsule, 50mg  PO TID x 1D, then 25-50mg  PO BID X 1D, then 25-50mg  PO QD X 1D, Disp: 10 capsule, Rfl: 0   mirtazapine (REMERON) 15 MG tablet, Take 15 mg by mouth daily., Disp: , Rfl:    omeprazole (PRILOSEC) 20 MG capsule, Take 20 mg by mouth daily., Disp: , Rfl:       Objective:  BP 121/63   Pulse 72   Ht 5' 4 (1.626 m)   Wt 107 lb 3.2 oz (48.6 kg)   SpO2 94% Comment: ra  BMI 18.40 kg/m  Wt Readings from Last 3 Encounters:  05/24/24 107 lb 3.2 oz (48.6 kg)  03/18/24 112 lb 3.2 oz (50.9 kg)  10/01/23 104 lb 3.2 oz (47.3 kg)   BMI Readings from Last 3 Encounters:  05/24/24 18.40 kg/m  03/18/24 19.26 kg/m  10/01/23 17.89 kg/m   SpO2 Readings from Last 3 Encounters:  05/24/24 94%  03/18/24 97%  02/10/24 93%    Physical Exam General: NAD, alert, WD, WN Eyes: PERRL, no scleral icterus ENMT: oropharynx clear, good dentition, no oral lesions, mallampati score I Skin: warm, intact, no rashes Neck: JVD flat, ROM and lymph  node assessment normal CV: RRR, no MRG, nl S1 and S2, no peripheral edema Resp: clear to auscultation bilaterally, no wheezes, rales, or rhonchi, normal effort, no clubbing/cyanosis Abdom: Normoactive bowel sounds, soft, nontender, nondistended, no hepatosplenomegaly Neuro: Awake alert oriented to person place time and situation  Diagnostic Review:  Last CBC Lab Results  Component Value Date   WBC 6.5 02/09/2024   HGB 16.4 02/09/2024   HCT 46.7 02/09/2024   MCV 102.0 (H) 02/09/2024   MCH 35.8 (H) 02/09/2024   RDW 13.0 02/09/2024   PLT 151 02/09/2024   Last metabolic panel Lab Results  Component Value Date   GLUCOSE 74 02/09/2024   NA 136 02/09/2024   K 3.6 02/09/2024   CL 101 02/09/2024   CO2 26 02/09/2024   BUN 9 02/09/2024   CREATININE 0.69 02/09/2024   GFRNONAA >60 02/09/2024   CALCIUM 8.9 02/09/2024   PROT 7.2 02/09/2024   ALBUMIN 3.0 (L) 02/09/2024   BILITOT 1.5 (H) 02/09/2024   ALKPHOS 126 02/09/2024   AST 43 (H) 02/09/2024   ALT 28 02/09/2024   ANIONGAP 9 02/09/2024    LDCT 05/2023: 17.9 mm LLL SPN  PET/CT 12/2023: 18 mm LLL SPN, PET AVID max SUV 1.7, concern for vascular lesion, recommends CTA Chest  CTA Chest 04/15/24: 18 mm LLL SPN, slow growth compared to 05/2023 concerning for primary bronchogenic carcinoma     Assessment & Plan:   Assessment & Plan Solitary pulmonary nodule on lung CT  Cigarette nicotine dependence without complication Smoking/Tobacco Cessation Counseling Christopher Austin is a current user of tobacco or nicotine products. He is considering quitting at this time. Counseling provided today addressed the risks of continued use and the benefits of cessation. Discussed tobacco/nicotine use history, readiness to quit, and evidence-based treatment options including behavioral strategies, support resources, and pharmacologic therapies. Provided encouragement and educational materials on steps and resources to quit smoking. Patient questions  were addressed, and follow-up recommended for continued support. Total time spent on counseling: 8 minutes.   Assessment and Plan Assessment & Plan Solitary pulmonary nodule, left lower lobe 1.7 x 1.7 cm nodule in left lower lobe with slight PET uptake suggests malignancy. Biopsy required for definitive diagnosis. Differential includes lung cancer. - Order Super D CT scan prior to biopsy. - Schedule robotic bronchoscopy for biopsy under general anesthesia due to lower pneumothorax risk (3%) compared to percutaneous needle biopsy (20%), despite slightly lower diagnostic yield (85% vs. 90%). - Ensure fasting from midnight before the procedure. - Arrange transportation post-procedure due to sedation.  Unintentional weight loss Weight decreased from 120 lbs to 112 lbs.  Tobacco use disorder Smokes half a pack per day, history of smoking since age 53, peaking at two packs per day. Significant risk factor for lung cancer.  Follow-Up Follow-up necessary post-biopsy to determine nodule nature. - Review lung function test results  to determine surgical candidacy if nodule is malignant.  Orders Placed This Encounter  Procedures   Procedural/ Surgical Case Request: VIDEO BRONCHOSCOPY WITH ENDOBRONCHIAL NAVIGATION   CT SUPER D CHEST WO CONTRAST   I spent 40 minutes reviewing patient's chart including prior consultant notes, imaging, and PFTs as well as face-to-face with the patient, over half in discussion of the diagnosis and the importance of compliance with the treatment plan.   Return in about 4 weeks (around 06/21/2024).   Bethel Sirois, MD "

## 2024-05-24 ENCOUNTER — Telehealth: Payer: Self-pay | Admitting: Pulmonary Disease

## 2024-05-24 ENCOUNTER — Encounter: Payer: Self-pay | Admitting: Pulmonary Disease

## 2024-05-24 ENCOUNTER — Other Ambulatory Visit: Payer: Self-pay

## 2024-05-24 ENCOUNTER — Ambulatory Visit (INDEPENDENT_AMBULATORY_CARE_PROVIDER_SITE_OTHER): Admitting: Pulmonary Disease

## 2024-05-24 ENCOUNTER — Ambulatory Visit (HOSPITAL_COMMUNITY)
Admission: RE | Admit: 2024-05-24 | Discharge: 2024-05-24 | Disposition: A | Discharge status: Home / Self Care | Source: Ambulatory Visit | Attending: Pulmonary Disease | Admitting: Pulmonary Disease

## 2024-05-24 ENCOUNTER — Encounter (HOSPITAL_COMMUNITY): Payer: Self-pay | Admitting: Pulmonary Disease

## 2024-05-24 VITALS — BP 121/63 | HR 72 | Ht 64.0 in | Wt 107.2 lb

## 2024-05-24 DIAGNOSIS — F17210 Nicotine dependence, cigarettes, uncomplicated: Secondary | ICD-10-CM

## 2024-05-24 DIAGNOSIS — R911 Solitary pulmonary nodule: Secondary | ICD-10-CM | POA: Insufficient documentation

## 2024-05-24 NOTE — Patient Instructions (Signed)
  VISIT SUMMARY: Today, we discussed a nodule found in your left lung and planned the next steps for diagnosis. We also addressed your recent weight loss and smoking history.  YOUR PLAN: SOLITARY PULMONARY NODULE, LEFT LOWER LOBE: A nodule in your left lung was found, which may be cancerous. We need to do a biopsy to find out for sure. -We will do a detailed lung CT scan before the biopsy. -You will have a robotic bronchoscopy for the biopsy under general anesthesia. This has a lower risk of lung collapse compared to other methods. -Do not eat or drink anything after midnight before the procedure. -Arrange for someone to drive you home after the procedure because you will be sedated.  UNINTENTIONAL WEIGHT LOSS: You have lost weight without trying, which can be concerning. -We will monitor your weight and appetite closely.  TOBACCO USE DISORDER: You have a long history of smoking, which increases your risk for lung cancer. -Consider reducing or quitting smoking to improve your overall health.  FOLLOW-UP: We need to follow up after the biopsy to discuss the results and next steps. -We will review your lung function test results to see if surgery is an option if the nodule is cancerous.                      Contains text generated by [employer]bridge.                                 Contains text generated by Abridge.

## 2024-05-24 NOTE — Telephone Encounter (Signed)
 Sending to [employer] to [facility]

## 2024-05-24 NOTE — Telephone Encounter (Signed)
 Please schedule the following:  Provider performing procedure:Dewald/Brizeida Mcmurry Diagnosis: Solitary Pulmonary Nodule Which side if for nodule / mass? Left Procedure: Navigation bronchoscopy  Has patient been spoken to by Provider and given informed consent? Yes Anesthesia: General Do you need Fluro? Yes Duration of procedure: 1.5 hours Date: 10/30 Alternate Date: 11/6  Time: Any Location: [facility] Does patient have OSA? No DM? No Or Latex allergy? No Medication Restriction/ Anticoagulate/Antiplatelet: N/A Pre-op Labs Ordered:determined by Anesthesia Imaging request: Super D CT STAT  (If, SuperDimension CT Chest, please have STAT courier sent to ENDO)

## 2024-05-24 NOTE — Progress Notes (Signed)
 PCP - [facility] Cardiologist - none (Dr Sheppard MCDowell last OV 10/29/15) Pulmonology -Dr Ozell [facility] - Dr Belvie Clara  CT Chest x-ray - 04/15/24 EKG - DOS Stress Test - 05/26/12 ECHO - 05/02/15 Cardiac Cath - n/a  ICD Pacemaker/Loop - n/a  Sleep Study -  n/a  Diabetes - n/a  Aspirin  & Blood Thinner Instructions:  n/a  NPO  Anesthesia review: Yes  STOP now taking any Aspirin  (unless otherwise instructed by your surgeon), Aleve , Naproxen , Ibuprofen , Motrin , Advil , Goody's, BC's, all herbal medications, fish oil, and all vitamins.   Coronavirus Screening Do you have any of the following symptoms:  Cough yes/no: No Fever (>100.96F)  yes/no: No Runny nose yes/no: No Sore throat yes/no: No Difficulty breathing/shortness of breath  yes/no: No  Have you traveled in the last 14 days and where? yes/no: No  Patient verbalized understanding of instructions that were given via phone.

## 2024-05-25 NOTE — Anesthesia Preprocedure Evaluation (Addendum)
 Anesthesia Evaluation    Reviewed: Allergy & Precautions, Patient's Chart, lab work & pertinent test results  Airway        Dental   Pulmonary Current Smoker  LLL pulmonary nodule w 8lb wt loss          Cardiovascular hypertension, Pt. on medications   NICM EF 35-40%   Neuro/Psych  Headaches, Seizures - (2nd Etoh),     GI/Hepatic ,GERD  ,,(+)     substance abuse  alcohol use, Hepatitis -, C  Endo/Other    Renal/GU      Musculoskeletal   Abdominal   Peds  Hematology   Anesthesia Other Findings   Reproductive/Obstetrics                              Anesthesia Physical Anesthesia Plan  ASA: 3  Anesthesia Plan: General   Post-op Pain Management: Precedex   Induction: Intravenous  PONV Risk Score and Plan: 2 and Treatment may vary due to age or medical condition, Ondansetron  and Midazolam   Airway Management Planned: Oral ETT  Additional Equipment: None  Intra-op Plan:   Post-operative Plan: Extubation in OR  Informed Consent:      Dental advisory given  Plan Discussed with: CRNA and Surgeon  Anesthesia Plan Comments: (PAT note written 05/25/2024 by Isaiah Ruder, PA-C.  )         Anesthesia Quick Evaluation

## 2024-05-25 NOTE — Progress Notes (Addendum)
 Anesthesia Chart Review: SAME DAY WORK-UP  Case: [id number] Date/Time: 05/26/24 0730   Procedure: VIDEO BRONCHOSCOPY WITH ENDOBRONCHIAL NAVIGATION (Left)   Anesthesia type: General   Diagnosis: Solitary pulmonary nodule on lung CT [R91.1]   Pre-op diagnosis: solitary pulmonary nodule   [facility] CARD[facility] 3 / [facility]   Surgeons: Kara Dorn NOVAK, MD       DISCUSSION: Patient is a 68 year old male scheduled for the above procedure. He was evaluated by pulmonologist Dr. Catherine on 05/24/2024 for LLL pulmonary nodule, first noted on [facility] CT 06/24/2023. + Smoker. Unintentional weight loss of 8 lbs (120 lb to 112 lb). Biopsy recommended for definitive diagnosis.  History includes smoking, HTN, alcohol abuse, chronic hepatitis C (s/p Mavyret 2019), seizures (last 1990's), cardiomyopathy (EF 35-40%, no significant ischemia 04/2012 NST, NICM suspected related to ETOH; EF 50-55% 04/2015), dysrhythmia (PSVT, bradycardia 2013), elevated PSA.   02/09/2024 ED visit for alcohol withdrawal after he tied to significantly decreased his alcohol intake on his own. Presented with dizziness, lightheadedness, hand cramping, headache. Normally drank from when I wake up to when I go to bed (beer and liquor), but had only had 3 beers that day. Minor tremor noted. Mild alcohol withdrawal suspected. CIWA score 3. Ativan  given and discharged with Librium  taper and resources for out-patient rehabilitation. Alcohol intake is currently documented as 12 standard drinks per week.  He is not currently followed by cardiology. Initially had an evaluation in 04/2012 by Debera Savant, MD after he was noted to be bradycardic when he presented for colonoscopy. HR was in the 40's. Eco done on 05/19/2012 and showed LVEF 35-40% with global LV hypokinesis. 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 Holter monitor and nuclear stress test ordered and done on 05/26/2012. Exercise  Myoview showed no clear evidence of chronotropic incompetence, LVEF 37%, and no large ischemic defects with possible scar in the inferior wall. He did have bursts of probable PSVT during exercise. Holter monitor showed sinus bradycardia with average HR 56 bpm, low of 40 bpm, no pauses, brief 3 beat run of SVT. NICM suspected. Managed with amlodipine , ACEi. No b-blockers given bradycardia. Follow-up TTE 55% 11/2012 and 50-55% 04/2015. Overall doing okay at 10/29/2015 follow-up, but still drinking alcohol, although had cut down.  He is a same day work-up. Concern for lung cancer and needs bronchoscopy for tissue diagnosis. Anesthesia team to evaluate on the day of surgery.    VS: Wt 50.9 kg   BMI 19.26 kg/m  BP Readings from Last 3 Encounters:  05/24/24 121/63  04/15/24 120/68  03/18/24 135/76   Pulse Readings from Last 3 Encounters:  05/24/24 72  04/15/24 (!) 46  03/18/24 (!) 43    PROVIDERS: Nsumanganyi, Raina Elizabeth, NP is PCP ([facility]) Catherine Cools, MD is pulmonologist, referred by Darlean Sharper, MD to the nodule clinic Sherrilee Dover, MD is urologist    LABS: Most recent results in [facility] include: Lab Results  Component Value Date   WBC 6.5 02/09/2024   HGB 16.4 02/09/2024   HCT 46.7 02/09/2024   PLT 151 02/09/2024   GLUCOSE 74 02/09/2024   ALT 28 02/09/2024   AST 43 (H) 02/09/2024   NA 136 02/09/2024   K 3.6 02/09/2024   CL 101 02/09/2024   CREATININE 0.69 02/09/2024   BUN 9 02/09/2024   CO2 26 02/09/2024    PFT's 11/2018/25: FEV1 2.33 (88 % ) ratio 0.75 p 0 % improvement from saba  p 0 prior to study with DLCO 13.72 (63%) and FV curve flat insp portion with min concavity on exp curve   IMAGES: CT Super D Chest 05/24/2024:  IMPRESSION: 1. Imaging for bronchoscopy planning and guidance. 2. No significant change in size of spiculated left lower lobe nodule. Despite having minimal metabolic activity on previous PET-CT, this remains morphologically  suspicious for bronchogenic carcinoma. 3. No new or enlarging nodules. No adenopathy or pleural effusion. 4.  Aortic Atherosclerosis (ICD10-I70.0).   CTA Chest 04/15/2024: IMPRESSION: 1. Irregular solid 1.7 cm posterior left lower lobe pulmonary nodule, minimally increased in size since 06/24/2023 screening chest CT, highly suspicious for slow growing primary bronchogenic malignancy. 2. No thoracic adenopathy or other potential findings of metastatic disease in the chest. 3. Three-vessel coronary atherosclerosis. 4.  Aortic Atherosclerosis (ICD10-I70.0).   PET Scan 12/31/2023: IMPRESSION: Left lower lobe lung nodules again seen and relatively similar in size to the previous CT scan. This also has minimal uptake. There are however prominent vessels surrounding the lesion. Differential would include a vascular lesion versus a non hyperbolic lung mass. Would recommend further workup with a CTA of the chest to exclude a vascular lesion otherwise would recommend simple follow up surveillance in 6 months. - Abnormal uptake along the peripheral zone of the prostate on the left side. Please correlate with the patient's PSA and other history. The prostate itself is overall enlarged and there is trabeculated wall thickening of the urinary bladder.   EKG: For day of surgery as indicated.  EKG 03/15/2018: Sinus rhythm Atrial premature complex Borderline right axis deviation Anteroseptal infarct, old sinus with PACs Confirmed by Carita Senior [phone]) on 03/15/2018 3:10:41 AM   CV: Echo 05/12/2015: Study Conclusions  - Left ventricle: The cavity size was normal. Wall thickness was    normal. Systolic function was normal. The estimated ejection    fraction was in the range of 50% to 55%. Wall motion was normal;    there were no regional wall motion abnormalities. Left    ventricular diastolic function parameters were normal.  - Left atrium: The atrium was mildly dilated.  -  Comparison: 05/19/2012: LVEF 35-40%, global LV hypokinesis, mild LVH, diastolic dysfunction; 12/17/2012 LVEF 55%, no RWMA, no ASD or PFO   Nuclear stress test 05/26/2012: IMPRESSION:  Abnormal exercise Myoview.  There were no diagnostic ST-segment  abnormalities.  The patient did not have clear evidence of  chronotropic incompetence with adequate heart rate response.  He  did have bursts of what appears to be PSVT and occasional to  frequent ventricular ectopy.  Perfusion imaging shows evidence of  scar versus attenuation affecting a small portion of the  anteroseptum at the apex at a moderate sized inferior defect.  No  large ischemic zones are noted and the ejection fraction was 37%  with global hypokinesis.    Holter Monitor 05/26/2012: IMPRESSION:  Unremarkable continuous electrocardiographic recording with no significant arrhythmias, but a relatively slow average heart rate of 56 minutes bpm.  The lowest recorded rate was 40 beats per minute. There was virtually no sinus tachycardia.  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   Chronic hepatitis C (HCC)    Chronic pain    Essential hypertension, benign    Headache(784.0)    History of alcohol abuse    History of seizures    none since the 1990s   Lung  nodule 2025   lower left lobe   PSVT (paroxysmal supraventricular tachycardia)    Noted during exercise testing   Seizure (HCC)    only 2 - years ago, no meds    Past Surgical History:  Procedure Laterality Date   COLONOSCOPY  05/19/2012   Dr. 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 10 year screening   Right hand surgery  2011   Abscess drainage    MEDICATIONS: No current facility-administered medications for this encounter.    amLODipine  (NORVASC ) 5 MG tablet   chlordiazePOXIDE  (LIBRIUM ) 25 MG capsule    mirtazapine (REMERON) 15 MG tablet   omeprazole (PRILOSEC) 20 MG capsule     Isaiah Ruder, PA-C Surgical Short Stay/Anesthesiology [facility] Phone [phone] [facility] Phone [phone] 05/25/2024 12:47 PM

## 2024-05-26 ENCOUNTER — Telehealth: Payer: Self-pay

## 2024-05-26 ENCOUNTER — Other Ambulatory Visit: Payer: Self-pay

## 2024-05-26 ENCOUNTER — Ambulatory Visit (HOSPITAL_COMMUNITY): Admitting: Vascular Surgery

## 2024-05-26 ENCOUNTER — Ambulatory Visit (HOSPITAL_COMMUNITY)
Admission: RE | Admit: 2024-05-26 | Discharge: 2024-05-26 | Disposition: A | Discharge status: Home / Self Care | Attending: Pulmonary Disease | Admitting: Pulmonary Disease

## 2024-05-26 ENCOUNTER — Encounter (HOSPITAL_COMMUNITY)
Admission: RE | Disposition: A | Discharge status: Home / Self Care | Payer: Self-pay | Source: Home / Self Care | Attending: Pulmonary Disease

## 2024-05-26 ENCOUNTER — Encounter (HOSPITAL_COMMUNITY): Admitting: Vascular Surgery

## 2024-05-26 ENCOUNTER — Encounter (HOSPITAL_COMMUNITY): Payer: Self-pay | Admitting: Pulmonary Disease

## 2024-05-26 ENCOUNTER — Ambulatory Visit (HOSPITAL_COMMUNITY)

## 2024-05-26 DIAGNOSIS — R911 Solitary pulmonary nodule: Secondary | ICD-10-CM | POA: Diagnosis present

## 2024-05-26 DIAGNOSIS — I4891 Unspecified atrial fibrillation: Secondary | ICD-10-CM | POA: Insufficient documentation

## 2024-05-26 DIAGNOSIS — Z539 Procedure and treatment not carried out, unspecified reason: Secondary | ICD-10-CM | POA: Insufficient documentation

## 2024-05-26 HISTORY — DX: Unspecified convulsions: R56.9

## 2024-05-26 LAB — CBC
HCT: 43 % (ref 39.0–52.0)
Hemoglobin: 14.5 g/dL (ref 13.0–17.0)
MCH: 32.5 pg (ref 26.0–34.0)
MCHC: 33.7 g/dL (ref 30.0–36.0)
MCV: 96.4 fL (ref 80.0–100.0)
Platelets: 316 K/uL (ref 150–400)
RBC: 4.46 MIL/uL (ref 4.22–5.81)
RDW: 14.1 % (ref 11.5–15.5)
WBC: 6.4 K/uL (ref 4.0–10.5)
nRBC: 0 % (ref 0.0–0.2)

## 2024-05-26 LAB — COMPREHENSIVE METABOLIC PANEL WITH GFR
ALT: 23 U/L (ref 0–44)
AST: 45 U/L — ABNORMAL HIGH (ref 15–41)
Albumin: 3.5 g/dL (ref 3.5–5.0)
Alkaline Phosphatase: 114 U/L (ref 38–126)
Anion gap: 14 (ref 5–15)
BUN: 8 mg/dL (ref 8–23)
CO2: 22 mmol/L (ref 22–32)
Calcium: 9 mg/dL (ref 8.9–10.3)
Chloride: 102 mmol/L (ref 98–111)
Creatinine, Ser: 0.74 mg/dL (ref 0.61–1.24)
GFR, Estimated: >60 mL/min (ref >60)
Glucose, Bld: 103 mg/dL — ABNORMAL HIGH (ref 70–99)
Potassium: 4.7 mmol/L (ref 3.5–5.1)
Sodium: 138 mmol/L (ref 135–145)
Total Bilirubin: 1.2 mg/dL (ref 0.0–1.2)
Total Protein: 7.3 g/dL (ref 6.5–8.1)

## 2024-05-26 SURGERY — CANCELLED PROCEDURE

## 2024-05-26 MED ORDER — CHLORHEXIDINE GLUCONATE 0.12 % MT SOLN
15.0000 mL | Freq: Once | OROMUCOSAL | Status: AC
  Administered 2024-05-26: 15 mL via OROMUCOSAL

## 2024-05-26 MED ORDER — LACTATED RINGERS IV SOLN: INTRAVENOUS | Status: DC

## 2024-05-26 NOTE — Addendum Note (Signed)
 Addended by: Wrenley Sayed on: 05/26/2024 12:01 PM   Modules accepted: Orders

## 2024-05-26 NOTE — Telephone Encounter (Signed)
 Spoke with [name] and he is stating that he made is to his Bronch this morning but he was turned away because his rate was to low so he needs to see a cardiologists first and then reschedule to see us  after to set up his bronch       Copied from CRM [id number]. Topic: General - Transportation >> May 25, 2024  5:49 PM Lavanda D wrote: Reason for CRM: Patient has a Bronchoscopy tomorrow morning with Dr. Kara and is having trouble getting some transportation. Advised patient that we cannot set up transportation unfortunately but that I will send a message (per leader recommendation) to see if there is any way the clinic can advise on how to get transportation/any resources available. Also recommended reaching out to his insurance company regarding transportation, his procedure is at 5am so there is probably not enough time to arrange this. Please call back to reschedule as well.   ----------------------------------------------------------------------- From previous Reason for Contact - Other: Reason for CRM:

## 2024-05-26 NOTE — Progress Notes (Signed)
 Dr. Kara made aware of and reviewed today's EKG showing A-Fib. Dr. Jefm also made aware. No new orders received at this time.

## 2024-05-26 NOTE — Addendum Note (Signed)
 Addended by: Allisyn Kunz on: 05/26/2024 12:03 PM   Modules accepted: Orders

## 2024-05-26 NOTE — Progress Notes (Signed)
 Today's EKG reviewed by Dr. Jefm. After reviewing, Dr. Jefm canceled today's procedure. Dr. Jefm instructed the patient to follow up with his PCP today to get a referral to Cardiology for new onset Atrial Fibrillation. Dr. Jefm discussed this with the patient and his daughter.

## 2024-06-03 ENCOUNTER — Encounter: Payer: Self-pay | Admitting: Cardiology

## 2024-06-03 ENCOUNTER — Ambulatory Visit

## 2024-06-03 ENCOUNTER — Ambulatory Visit: Attending: Cardiology | Admitting: Cardiology

## 2024-06-03 VITALS — BP 118/52 | HR 52 | Ht 64.0 in | Wt 106.0 lb

## 2024-06-03 DIAGNOSIS — I471 Supraventricular tachycardia, unspecified: Secondary | ICD-10-CM

## 2024-06-03 DIAGNOSIS — F17200 Nicotine dependence, unspecified, uncomplicated: Secondary | ICD-10-CM | POA: Diagnosis not present

## 2024-06-03 DIAGNOSIS — I10 Essential (primary) hypertension: Secondary | ICD-10-CM | POA: Diagnosis not present

## 2024-06-03 DIAGNOSIS — I429 Cardiomyopathy, unspecified: Secondary | ICD-10-CM | POA: Diagnosis not present

## 2024-06-03 NOTE — Progress Notes (Signed)
 Cardiology Office Note:    Date:  06/03/2024   ID:  Christopher Austin, DOB 1955-09-09, MRN [id number]  PCP:  Benjamin Raina Elizabeth, NP   [facility] Providers Cardiologist:  None     Referring MD: Catherine Cools, MD   Chief Complaint  Patient presents with   New Patient (Initial Visit)    New patient /  The pt has been doing well with no complaints of chest pain, chest pressure or SOB, medciation reviewed verbally with patient    History of Present Illness:    Christopher Austin is a 68 y.o. male with a hx of nonischemic cardiomyopathy with improved EF 50-55%, hypertension, EtOH abuse, paroxysmal SVT, current smoker x 40+ years presenting to establish care.  Patient scheduled to undergo bronchoscopy 2 to 3 weeks ago due to a pulmonary nodule.  Heart rate noted to be irregular, was told he may have A-fib.  EKG obtained showed atrial tachycardia/SVT and sinus bradycardia.  He denies palpitations.  Denies dizziness or syncope.  Drinks about 4-5 beers daily.  Previously drank all day, beers, liquor.  States feeling well, has no concerns.  Last seen for cardiology in 2016, echo at that time showed EF 50 to 55%.  Being seen due to nonischemic cardiomyopathy, deemed nonischemic in the context of alcohol abuse.  Also has a history of bradycardia.  Beta-blocker was given for SVT due to history of bradycardia.  Initial echocardiogram 2013 showed EF 35 to 40%.  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   Chronic hepatitis C (HCC)    Chronic pain    Essential hypertension, benign    Headache(784.0)    History of alcohol abuse    History of seizures    none since the 1990s   Lung nodule 2025   lower left lobe   PSVT (paroxysmal supraventricular tachycardia)    Noted during exercise testing   Seizure (HCC)    only 2 - years ago, no meds    Past Surgical History:   Procedure Laterality Date   COLONOSCOPY  05/19/2012   Dr. 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 10 year screening   Right hand surgery  2011   Abscess drainage    Current Medications: Current Meds  Medication Sig   amLODipine  (NORVASC ) 5 MG tablet Take 5 mg by mouth daily.   chlordiazePOXIDE  (LIBRIUM ) 25 MG capsule 50mg  PO TID x 1D, then 25-50mg  PO BID X 1D, then 25-50mg  PO QD X 1D   mirtazapine (REMERON) 15 MG tablet Take 15 mg by mouth daily.   naproxen  (NAPROSYN ) 500 MG tablet Take 500 mg by mouth 2 (two) times daily.   omeprazole (PRILOSEC) 20 MG capsule Take 20 mg by mouth daily.     Allergies:   Patient has no known allergies.   Social History   Socioeconomic History   Marital status: Divorced    Spouse name: Not on file   Number of children: Not on file   Years of education: Not on file   Highest education level: Not on file  Occupational History   Occupation: disability  Tobacco Use   Smoking status: Every Day    Current packs/day: 1.00    Average packs/day: 1 pack/day for 51.8 years (51.8 ttl pk-yrs)    Types: Cigarettes    Start date: 07/28/1972  Smokeless tobacco: Never   Tobacco comments:    Trying to cut back form 1ppd to 1/2 ppd per pt on 05/24/24  Vaping Use   Vaping status: Never Used  Substance and Sexual Activity   Alcohol use: Yes    Alcohol/week: 12.0 standard drinks of alcohol    Types: 12 Cans of beer per week    Comment: daily   Drug use: No   Sexual activity: Yes    Partners: Female  Other Topics Concern   Not on file  Social History Narrative   Not on file   Social Drivers of Health   Financial Resource Strain: Not on file  Food Insecurity: Not on file  Transportation Needs: Not on file  Physical Activity: Not on file  Stress: Not on file  Social Connections: Not on file     Family History: The patient's family history includes Hyperlipidemia in his mother; Hypertension  in his mother. There is no history of Colon cancer or Colon polyps.  ROS:   Please see the history of present illness.     All other systems reviewed and are negative.  EKGs/Labs/Other Studies Reviewed:    The following studies were reviewed today:  EKG Interpretation Date/Time:  Friday June 03 2024 11:04:26 EST Ventricular Rate:  52 PR Interval:    QRS Duration:  94 QT Interval:  454 QTC Calculation: 422 R Axis:   16  Text Interpretation: Sinus bradycardia Low voltage QRS Nonspecific T wave abnormality Confirmed by Darliss Rogue ([id number]) on 06/03/2024 11:35:35 AM    Recent Labs: 05/26/2024: ALT 23; BUN 8; Creatinine, Ser 0.74; Hemoglobin 14.5; Platelets 316; Potassium 4.7; Sodium 138  Recent Lipid Panel    Component Value Date/Time   CHOL 195 02/07/2022 0827   TRIG 61 02/07/2022 0827   HDL 74 02/07/2022 0827   CHOLHDL 2.6 02/07/2022 0827   VLDL 12 02/07/2022 0827   LDLCALC 109 (H) 02/07/2022 0827     Risk Assessment/Calculations:             Physical Exam:    VS:  BP (!) 118/52 (BP Location: Right Arm, Patient Position: Sitting, Cuff Size: Normal)   Pulse (!) 52   Ht 5' 4 (1.626 m)   Wt 106 lb (48.1 kg)   SpO2 95%   BMI 18.19 kg/m     Wt Readings from Last 3 Encounters:  06/03/24 106 lb (48.1 kg)  05/26/24 111 lb (50.3 kg)  05/24/24 107 lb 3.2 oz (48.6 kg)     GEN:  Well nourished, well developed in no acute distress HEENT: Normal NECK: No JVD; No carotid bruits CARDIAC: RRR, no murmurs, rubs, gallops RESPIRATORY: Diminished breath sounds, no wheezing. ABDOMEN: Soft, non-tender, non-distended MUSCULOSKELETAL:  No edema; No deformity  SKIN: Warm and dry NEUROLOGIC:  Alert and oriented x 3 PSYCHIATRIC:  Normal affect   ASSESSMENT:    1. PSVT (paroxysmal supraventricular tachycardia)   2. Cardiomyopathy, unspecified type (HCC)   3. Primary hypertension   4. Current smoker    PLAN:    In order of problems listed above:  History of  paroxysmal SVT, EKG obtained on preop reviewed, showed atrial tachycardia, sinus bradycardia.  No evidence for atrial fibrillation.  Order cardiac monitor to evaluate A-fib or other significant arrhythmias.  Obtain echo. History of cardiomyopathy, deemed nonischemic likely from EtOH abuse.  Last echo 2016 EF 50 to 55%.  Repeat echocardiogram. Hypertension, BP controlled.  Continue Norvasc  5 mg daily. Current smoker, smoking cessation advised.  Also advised to cut back on EtOH.  Follow-up after cardiac testing      Medication Adjustments/Labs and Tests Ordered: Current medicines are reviewed at length with the patient today.  Concerns regarding medicines are outlined above.  Orders Placed This Encounter  Procedures   LONG TERM MONITOR (3-14 DAYS)   EKG 12-Lead   ECHOCARDIOGRAM COMPLETE   No orders of the defined types were placed in this encounter.   Patient Instructions  Medication Instructions:  Your physician recommends that you continue on your current medications as directed. Please refer to the Current Medication list given to you today.   *If you need a refill on your cardiac medications before your next appointment, please call your pharmacy*  Lab Work: No labs ordered today  If you have labs (blood work) drawn today and your tests are completely normal, you will receive your results only by: MyChart Message (if you have MyChart) OR A paper copy in the mail If you have any lab test that is abnormal or we need to change your treatment, we will call you to review the results.  Testing/Procedures: Your physician has requested that you have an echocardiogram. Echocardiography is a painless test that uses sound waves to create images of your heart. It provides your doctor with information about the size and shape of your heart and how well your heart's chambers and valves are working.   You may receive an ultrasound enhancing agent through an IV if needed to better visualize your  heart during the echo. This procedure takes approximately one hour.  There are no restrictions for this procedure.  This will take place at [address] [facility] [address] [facility], [place] [address]  Please note: We ask at that you not bring children with you during ultrasound (echo/ vascular) testing. Due to room size and safety concerns, children are not allowed in the ultrasound rooms during exams. Our front office staff cannot provide observation of children in our lobby area while testing is being conducted. An adult accompanying a patient to their appointment will only be allowed in the ultrasound room at the discretion of the ultrasound technician under special circumstances. We apologize for any inconvenience.   ZIO XT- Long Term Monitor Instructions  Your physician has requested you wear a ZIO patch monitor for 14 days.  This is a single patch monitor. [employer] supplies one patch monitor per enrollment. Additional stickers are not available. Please do not apply patch if you will be having a Nuclear Stress Test, Echocardiogram, Cardiac CT, MRI, or Chest Xray during the period you would be wearing the monitor. The patch cannot be worn during these tests. You cannot remove and re-apply the ZIO XT patch monitor.  Your ZIO patch monitor will be mailed 3 day USPS to your address on file. It may take 3-5 days to receive your monitor after you have been enrolled. Once you have received your monitor, please review the enclosed instructions. Your monitor has already been registered assigning a specific monitor serial number to you.  Billing and Patient Assistance Program Information  We have supplied [employer] with any of your insurance information on file for billing purposes.  [employer] offers a sliding scale Patient Assistance Program for patients that do not have insurance, or whose insurance does not completely cover the cost of the ZIO monitor.  You must apply for the Patient Assistance  Program to qualify for this discounted rate.  To apply, please call [employer] at [phone], select option 4,  select option 2, ask to apply for Patient Assistance Program. Meredeth will ask your household income, and how many people are in your household. They will quote your out-of-pocket cost based on that information. [employer] will also be able to set up a 20-month, interest-free payment plan if needed.  Applying the monitor   Shave hair from upper left chest.  Hold abrader disc by orange tab. Rub abrader in 40 strokes over the upper left chest as indicated in your monitor instructions.  Clean area with 4 enclosed alcohol pads. Let dry.  Apply patch as indicated in monitor instructions. Patch will be placed under collarbone on left side of chest with arrow pointing upward.  Rub patch adhesive wings for 2 minutes. Remove white label marked 1. Remove the white label marked 2. Rub patch adhesive wings for 2 additional minutes.  While looking in a mirror, press and release button in center of patch. A small green light will flash 3-4 times. This will be your only indicator that the monitor has been turned on.  Do not shower for the first 24 hours. You may shower after the first 24 hours.  Press the button if you feel a symptom. You will hear a small click. Record Date, Time and Symptom in the Patient Logbook.  When you are ready to remove the patch, follow instructions on the last 2 pages of Patient Logbook.  Stick patch monitor into the tabs at the bottom of the return box.  Place Patient Logbook in the blue and white box. Use locking tab on box and tape box closed securely. The blue and white box has prepaid postage on it. Please place it in the mailbox as soon as possible. Your physician should have your test results approximately 7-14 days after the monitor has been mailed back to [facility].  Call [facility] Customer Care at [phone] if you have questions regarding your ZIO XT  patch monitor.  Call them immediately if you see an orange light blinking on your monitor.  If your monitor falls off in less than 4 days, contact our Monitor department at [phone].  If your monitor becomes loose or falls off after 4 days call [employer] at [phone] for suggestions on securing your monitor.   Follow-Up: At [facility], you and your health needs are our priority.  As part of our continuing mission to provide you with exceptional heart care, our providers are all part of one team.  This team includes your primary Cardiologist (physician) and Advanced Practice Providers or APPs (Physician Assistants and Nurse Practitioners) who all work together to provide you with the care you need, when you need it.  Your next appointment:   3 month(s)  Provider:   You may see Dr Darliss or one of the following Advanced Practice Providers on your designated Care Team:   Lonni Meager, NP Lesley Maffucci, PA-C Bernardino Bring, PA-C Cadence [place], PA-C Tylene Lunch, NP Barnie Hila, NP    We recommend signing up for the patient portal called MyChart.  Sign up information is provided on this After Visit Summary.  MyChart is used to connect with patients for Virtual Visits (Telemedicine).  Patients are able to view lab/test results, encounter notes, upcoming appointments, etc.  Non-urgent messages can be sent to your provider as well.   To learn more about what you can do with MyChart, go to [email or web address].           Signed, Redell Darliss, MD  06/03/2024 1:45 PM  [facility]

## 2024-06-03 NOTE — Patient Instructions (Signed)
 Medication Instructions:  Your physician recommends that you continue on your current medications as directed. Please refer to the Current Medication list given to you today.   *If you need a refill on your cardiac medications before your next appointment, please call your pharmacy*  Lab Work: No labs ordered today  If you have labs (blood work) drawn today and your tests are completely normal, you will receive your results only by: MyChart Message (if you have MyChart) OR A paper copy in the mail If you have any lab test that is abnormal or we need to change your treatment, we will call you to review the results.  Testing/Procedures: Your physician has requested that you have an echocardiogram. Echocardiography is a painless test that uses sound waves to create images of your heart. It provides your doctor with information about the size and shape of your heart and how well your heart's chambers and valves are working.   You may receive an ultrasound enhancing agent through an IV if needed to better visualize your heart during the echo. This procedure takes approximately one hour.  There are no restrictions for this procedure.  This will take place at [address] [facility] [address] [facility], [place] [address]  Please note: We ask at that you not bring children with you during ultrasound (echo/ vascular) testing. Due to room size and safety concerns, children are not allowed in the ultrasound rooms during exams. Our front office staff cannot provide observation of children in our lobby area while testing is being conducted. An adult accompanying a patient to their appointment will only be allowed in the ultrasound room at the discretion of the ultrasound technician under special circumstances. We apologize for any inconvenience.   ZIO XT- Long Term Monitor Instructions  Your physician has requested you wear a ZIO patch monitor for 14 days.  This is a single patch monitor. [employer]  supplies one patch monitor per enrollment. Additional stickers are not available. Please do not apply patch if you will be having a Nuclear Stress Test, Echocardiogram, Cardiac CT, MRI, or Chest Xray during the period you would be wearing the monitor. The patch cannot be worn during these tests. You cannot remove and re-apply the ZIO XT patch monitor.  Your ZIO patch monitor will be mailed 3 day USPS to your address on file. It may take 3-5 days to receive your monitor after you have been enrolled. Once you have received your monitor, please review the enclosed instructions. Your monitor has already been registered assigning a specific monitor serial number to you.  Billing and Patient Assistance Program Information  We have supplied [employer] with any of your insurance information on file for billing purposes.  [employer] offers a sliding scale Patient Assistance Program for patients that do not have insurance, or whose insurance does not completely cover the cost of the ZIO monitor.  You must apply for the Patient Assistance Program to qualify for this discounted rate.  To apply, please call [employer] at [phone], select option 4, select option 2, ask to apply for Patient Assistance Program. Meredeth will ask your household income, and how many people are in your household. They will quote your out-of-pocket cost based on that information. [employer] will also be able to set up a 17-month, interest-free payment plan if needed.  Applying the monitor   Shave hair from upper left chest.  Hold abrader disc by orange tab. Rub abrader in 40 strokes over the upper left chest as indicated  in your monitor instructions.  Clean area with 4 enclosed alcohol pads. Let dry.  Apply patch as indicated in monitor instructions. Patch will be placed under collarbone on left side of chest with arrow pointing upward.  Rub patch adhesive wings for 2 minutes. Remove white label marked 1. Remove the white label marked 2. Rub  patch adhesive wings for 2 additional minutes.  While looking in a mirror, press and release button in center of patch. A small green light will flash 3-4 times. This will be your only indicator that the monitor has been turned on.  Do not shower for the first 24 hours. You may shower after the first 24 hours.  Press the button if you feel a symptom. You will hear a small click. Record Date, Time and Symptom in the Patient Logbook.  When you are ready to remove the patch, follow instructions on the last 2 pages of Patient Logbook.  Stick patch monitor into the tabs at the bottom of the return box.  Place Patient Logbook in the blue and white box. Use locking tab on box and tape box closed securely. The blue and white box has prepaid postage on it. Please place it in the mailbox as soon as possible. Your physician should have your test results approximately 7-14 days after the monitor has been mailed back to [facility].  Call [facility] Customer Care at [phone] if you have questions regarding your ZIO XT patch monitor.  Call them immediately if you see an orange light blinking on your monitor.  If your monitor falls off in less than 4 days, contact our Monitor department at [phone].  If your monitor becomes loose or falls off after 4 days call [employer] at [phone] for suggestions on securing your monitor.   Follow-Up: At [facility], you and your health needs are our priority.  As part of our continuing mission to provide you with exceptional heart care, our providers are all part of one team.  This team includes your primary Cardiologist (physician) and Advanced Practice Providers or APPs (Physician Assistants and Nurse Practitioners) who all work together to provide you with the care you need, when you need it.  Your next appointment:   3 month(s)  Provider:   You may see Dr. Darliss or one of the following Advanced Practice Providers on your designated Care  Team:   Lonni Meager, NP Lesley Maffucci, PA-C Bernardino Bring, PA-C Cadence [place], PA-C Tylene Lunch, NP Barnie Hila, NP    We recommend signing up for the patient portal called MyChart.  Sign up information is provided on this After Visit Summary.  MyChart is used to connect with patients for Virtual Visits (Telemedicine).  Patients are able to view lab/test results, encounter notes, upcoming appointments, etc.  Non-urgent messages can be sent to your provider as well.   To learn more about what you can do with MyChart, go to [email or web address].

## 2024-06-07 ENCOUNTER — Ambulatory Visit: Admitting: Acute Care

## 2024-06-10 ENCOUNTER — Ambulatory Visit: Admitting: Acute Care

## 2024-07-01 ENCOUNTER — Ambulatory Visit: Admitting: Internal Medicine

## 2024-07-01 ENCOUNTER — Telehealth: Payer: Self-pay | Admitting: Pulmonary Disease

## 2024-07-01 NOTE — Telephone Encounter (Signed)
 Spoke with patient's mother(Daisy Alsten) on DPR--patient is rescheduled to see Dr. Catherine on Monday 07/18/24 at 1:00 pm--Mrs.Alsten states Mr. Gulbranson cell phone is broken ant that is why calls will not go through.  She stated she would be sure he received the message abut the appointment change

## 2024-07-04 ENCOUNTER — Ambulatory Visit: Attending: Cardiology

## 2024-07-08 ENCOUNTER — Ambulatory Visit: Payer: Self-pay | Admitting: Cardiology

## 2024-07-08 DIAGNOSIS — I471 Supraventricular tachycardia, unspecified: Secondary | ICD-10-CM

## 2024-07-15 NOTE — Progress Notes (Signed)
 Yes, I'm seeing him soon to set him up for a bronch.

## 2024-07-17 NOTE — Progress Notes (Deleted)
" ° °  Established Patient Pulmonology Office Visit   Subjective:  Patient ID: Christopher Austin, male    DOB: December 28, 1955  MRN: [id number]  CC: No chief complaint on file.   HPI  Christopher Austin is a 68 y/o M with a PMH significant for MDD, HTN, and GERD who is here for evaluation of LLL solitary pulmonary nodule.   Last seen on 05/24/2024, scheduled for navigational bronch, found to be profoundly bradycardic on day of procedure, the procedure was aborted. Cardiology saw him and did a Zio monitor and recommended echo. Zio monitor results in the chart.    {PULM QUESTIONNAIRES (Optional):33196}  ROS  {History (Optional):23778} Current Medications[1]      Objective:  There were no vitals taken for this visit. {Pulm Vitals (Optional):32837}  Physical Exam   Diagnostic Review:  {Labs (Optional):32838}  LDCT 05/2023: 17.9 mm LLL SPN   PET/CT 12/2023: 18 mm LLL SPN, PET AVID max SUV 1.7, concern for vascular lesion, recommends CTA Chest   CTA Chest 04/15/24: 18 mm LLL SPN, slow growth compared to 05/2023 concerning for primary bronchogenic carcinoma  CT Super D 05/24/2024: IMPRESSION: 1. Imaging for bronchoscopy planning and guidance. 2. No significant change in size of spiculated left lower lobe nodule. Despite having minimal metabolic activity on previous PET-CT, this remains morphologically suspicious for bronchogenic carcinoma. 3. No new or enlarging nodules. No adenopathy or pleural effusion. 4.  Aortic Atherosclerosis (ICD10-I70.0).     Assessment & Plan:   Assessment & Plan Solitary pulmonary nodule on lung CT   No orders of the defined types were placed in this encounter.     No follow-ups on file.   Ciara Kagan, MD     [1]  Current Outpatient Medications:    amLODipine  (NORVASC ) 5 MG tablet, Take 5 mg by mouth daily., Disp: , Rfl:    chlordiazePOXIDE  (LIBRIUM ) 25 MG capsule, 50mg  PO TID x 1D, then 25-50mg  PO BID X 1D, then 25-50mg  PO QD X 1D, Disp: 10  capsule, Rfl: 0   mirtazapine (REMERON) 15 MG tablet, Take 15 mg by mouth daily., Disp: , Rfl:    naproxen  (NAPROSYN ) 500 MG tablet, Take 500 mg by mouth 2 (two) times daily., Disp: , Rfl:    omeprazole (PRILOSEC) 20 MG capsule, Take 20 mg by mouth daily., Disp: , Rfl:   "

## 2024-07-18 ENCOUNTER — Other Ambulatory Visit

## 2024-07-18 ENCOUNTER — Ambulatory Visit: Admitting: Pulmonary Disease

## 2024-07-18 ENCOUNTER — Encounter: Payer: Self-pay | Admitting: Pulmonary Disease

## 2024-07-18 ENCOUNTER — Telehealth: Payer: Self-pay | Admitting: Pulmonary Disease

## 2024-07-18 DIAGNOSIS — R911 Solitary pulmonary nodule: Secondary | ICD-10-CM

## 2024-07-18 NOTE — Telephone Encounter (Signed)
 Please schedule the following:  Provider performing procedure:Nyair Depaulo Diagnosis: Left Lower Lobe Solitary Pulmonary Nodule Which side if for nodule / mass? left Procedure: navigational bronchoscopy  Has patient been spoken to by Provider and given informed consent? Yes Anesthesia: general Do you need Fluro? yes Duration of procedure: 1.5 hours Date: 08/06/2023 Alternate Date: No alternate date  Time: Afternoon appointments please Location: [facility] Does patient have OSA? No DM? N/A Or Latex allergy? None Medication Restriction/ Anticoagulate/Antiplatelet: None Pre-op Labs Ordered:determined by Anesthesia Imaging request: N/A  (If, SuperDimension CT Chest, please have STAT courier sent to ENDO)

## 2024-07-18 NOTE — Telephone Encounter (Signed)
 Called patient and informed him that it is imperative that we evaluate left lower lobe pulmonary nodule with biopsy given that this lesion is high risk for lung cancer. I told him I'm OK with scheduling his bronchoscopy without an office visit. The patient was agreeable to pursue biopsy on 08/05/2024 but without an office visit. I reached out to cardiology also regarding preoperative risk. He did have HR monitoring and was found to have the following results below:  Average heart rate 59, range 27-194 bpm. 2 episodes of VT lasting 5 beats. 53 episodes of paroxysmal SVT.  Longest lasting 7 beats. 9 pauses occurred, longest lasting for 4.2 seconds (around 6 and 8AM, unsure if this was associated with sleep). Frequent PACs 5.3% burden, occasional PVCs 1.7% burden. No atrial fibrillation or atrial flutter.  Note from cardiology on results: Average heart rate in 50s, will avoid beta-blockers or any AV nodal agents.  Continue current medicines as prescribed.

## 2024-07-18 NOTE — Telephone Encounter (Signed)
 Colonoscopy being planned, cardiac monitoring reviewed, episodes of paroxysmal SVT, no sustained arrhythmias, no A-fib or flutter.  Bronchoscopy being planned by pulmonary medicine.  Procedure deemed low risk from a cardiac perspective.  Okay for procedure, no additional testing indicated at this time.  Signed, Redell Cave, M.D. 07/18/2024 [facility] [place], [place] [phone]

## 2024-07-18 NOTE — Telephone Encounter (Signed)
 Called and spoke with patient to confirm 1:00 pm appointment today (07/18/24) with Dr. Kalvin asked that we cancel that appointemnt---I asked if he could be here by 3:00 pm this afternoon and he stated just to cancel the appointment--he did not wish to reschedule I will inform Dr. Alghanim

## 2024-07-18 NOTE — Telephone Encounter (Signed)
 Letter given by [place]. Case # [id number]. Sending to [employer] to [facility]

## 2024-07-20 ENCOUNTER — Ambulatory Visit: Admitting: Pulmonary Disease

## 2024-07-27 ENCOUNTER — Ambulatory Visit: Admitting: Urology

## 2024-07-27 DIAGNOSIS — R972 Elevated prostate specific antigen [PSA]: Secondary | ICD-10-CM

## 2024-07-29 ENCOUNTER — Ambulatory Visit

## 2024-08-02 NOTE — Anesthesia Preprocedure Evaluation (Addendum)
"                                    Anesthesia Evaluation  Patient identified by MRN, date of birth, ID band Patient awake    Reviewed: Allergy & Precautions, NPO status , Patient's Chart, lab work & pertinent test results  Airway Mallampati: II  TM Distance: >3 FB Neck ROM: Full    Dental  (+) Dental Advisory Given, Lower Dentures, Upper Dentures   Pulmonary Current Smoker and Patient abstained from smoking. Lung nodule    Pulmonary exam normal breath sounds clear to auscultation       Cardiovascular hypertension, Pt. on medications Normal cardiovascular exam+ dysrhythmias Supra Ventricular Tachycardia  Rhythm:Regular Rate:Normal     Neuro/Psych  Headaches, Seizures -,     GI/Hepatic ,GERD  Medicated,,(+)     substance abuse  alcohol use, Hepatitis -, C  Endo/Other  negative endocrine ROS    Renal/GU negative Renal ROS     Musculoskeletal negative musculoskeletal ROS (+)    Abdominal   Peds  Hematology negative hematology ROS (+)   Anesthesia Other Findings Day of surgery medications reviewed with the patient.  Reproductive/Obstetrics                              Anesthesia Physical Anesthesia Plan  ASA: 4  Anesthesia Plan: General   Post-op Pain Management: Tylenol  PO (pre-op)*   Induction: Intravenous  PONV Risk Score and Plan: 2 and Dexamethasone  and Ondansetron   Airway Management Planned: Oral ETT  Additional Equipment:   Intra-op Plan:   Post-operative Plan: Extubation in OR  Informed Consent: I have reviewed the patients History and Physical, chart, labs and discussed the procedure including the risks, benefits and alternatives for the proposed anesthesia with the patient or authorized representative who has indicated his/her understanding and acceptance.     Dental advisory given  Plan Discussed with: CRNA  Anesthesia Plan Comments: (PAT note written 08/02/2024 by Allison Zelenak, PA-C.  Rescheduled for 08/05/2024.   )         Anesthesia Quick Evaluation  "

## 2024-08-02 NOTE — Progress Notes (Signed)
 Anesthesia Chart Review: SAME DAY WORK-UP  Case: [id number] Date/Time: 08/05/24 0930   Procedure: VIDEO BRONCHOSCOPY WITH ENDOBRONCHIAL NAVIGATION (Left)   Anesthesia type: General   Diagnosis: Solitary pulmonary nodule on lung CT [R91.1]   Pre-op diagnosis: lung nodule   [facility] / [facility]   Surgeons: Catherine Cools, MD       DISCUSSION: Patient is a 69 year old male scheduled for the above procedure. He was evaluated by pulmonologist Dr. Catherine on 05/24/2024 for LLL pulmonary nodule, first noted on [facility] CT 06/24/2023. + Smoker. Unintentional weight loss of 8 lbs (120 lb to 112 lb). Biopsy recommended for definitive diagnosis. He presented for bronchoscopy on 05/26/2024, but surgery was cancelled after question on new onset afib. He has since had cardiology evaluation by Dr. Darliss on 06/03/2024. He reviewed EKG--no evidence of afib, but showed atrial tachycardia/SB. Cardiac monitor ordered with plan to update TTE (LVEF 50-55% 2016). Since then he had a 14 day monitor in 05/2024 that showed HR 27-194 with average 59 bpm, 2 runs of VT lasting up to 5 beats, 53 episodes of SVT lasting up to 7 beats, 9 pauses (longest 4.2 seconds in AM but unsure if associated with sleep), 5.3% PAC burden, 1.7% PVC burden, no afib/flutter. Dr. Darliss recommended avoid b-blocker and AV nodal agents. Echo has not been scheduled yet. He has cardiology follow-up on 09/05/2024.   In the interim, Dr. Darliss wrote on 07/18/2024, Colonoscopy being planned, cardiac monitoring reviewed, episodes of paroxysmal SVT, no sustained arrhythmias, no A-fib or flutter.   Bronchoscopy being planned by pulmonary medicine.  Procedure deemed low risk from a cardiac perspective.  Okay for procedure, no additional testing indicated at this time.    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     02/09/2024 ED visit for alcohol withdrawal after he tied to significantly decreased his alcohol intake on his own. Presented with dizziness, lightheadedness, hand cramping, headache. Normally drank from when I wake up to when I go to bed (beer and liquor), but had only had 3 beers that day. Minor tremor noted. Mild alcohol withdrawal suspected. CIWA score 3. Ativan  given and discharged with Librium  taper and resources for out-patient rehabilitation. Alcohol intake is currently documented as 12 standard drinks per week.   Prior to recent cardiology evaluation by Dr. Darliss, he had evaluation in 04/2012 by Debera Savant, MD after he was noted to be bradycardic when he presented for colonoscopy. HR was in the 40's. Eco done on 05/19/2012 and showed LVEF 35-40% with global LV hypokinesis. 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 Holter monitor and nuclear stress test ordered and done on 05/26/2012. Exercise Myoview showed no clear evidence of chronotropic incompetence, LVEF 37%, and no large ischemic defects with possible scar in the inferior wall. He did have bursts of probable PSVT during exercise. Holter monitor showed sinus bradycardia with average HR 56 bpm, low of 40 bpm, no pauses, brief 3 beat run of SVT. NICM suspected. Managed with amlodipine , ACEi. No b-blockers given bradycardia. Follow-up TTE 55% 11/2012 and 50-55% 04/2015. Overall doing okay at 10/29/2015 follow-up, but still drinking alcohol, although had cut down.   He is a same day work-up. Dr. Catherine thinks lesion is high risk for lung cancer, so wanted  to get case back on the OR schedule. He reached out to cardiology as above and no additional CV testing required prior to surgery. Anesthesia team to evaluate on the day of surgery.     VS:  Wt Readings  from Last 3 Encounters:  06/03/24 48.1 kg  05/26/24 50.3 kg  05/24/24 48.6 kg   BP Readings from Last 3 Encounters:  06/03/24 (!) 118/52  05/26/24 (!) 181/92  05/24/24 121/63   Pulse Readings from Last 3 Encounters:  06/03/24 (!) 52  05/26/24 68  05/24/24 72     PROVIDERS: Nsumanganyi, Raina Elizabeth, NP is PCP ([facility] Catherine Cools, MD is pulmonologist, referred by Darlean Sharper, MD to the nodule clinic Agbor-Etang, Redell, MD is cardiologist  Sherrilee Dover, MD is urologist     LABS: Most recent lab results in [facility] include: Lab Results  Component Value Date   WBC 6.4 05/26/2024   HGB 14.5 05/26/2024   HCT 43.0 05/26/2024   PLT 316 05/26/2024   GLUCOSE 103 (H) 05/26/2024   ALT 23 05/26/2024   AST 45 (H) 05/26/2024   NA 138 05/26/2024   K 4.7 05/26/2024   CL 102 05/26/2024   CREATININE 0.74 05/26/2024   BUN 8 05/26/2024   CO2 22 05/26/2024    PFT's 11/2018/25: FEV1 2.33 (88 % ) ratio 0.75 p 0 % improvement from saba p 0 prior to study with DLCO 13.72 (63%) and FV curve flat insp portion with min concavity on exp curve    IMAGES: CT Super D Chest 05/24/2024:  IMPRESSION: 1. Imaging for bronchoscopy planning and guidance. 2. No significant change in size of spiculated left lower lobe nodule. Despite having minimal metabolic activity on previous PET-CT, this remains morphologically suspicious for bronchogenic carcinoma. 3. No new or enlarging nodules. No adenopathy or pleural effusion. 4.  Aortic Atherosclerosis (ICD10-I70.0).   CTA Chest 04/15/2024: IMPRESSION: 1. Irregular solid 1.7 cm posterior left lower lobe pulmonary nodule, minimally increased in size since 06/24/2023 screening chest CT, highly suspicious for slow growing primary bronchogenic malignancy. 2. No thoracic adenopathy or other potential findings of metastatic disease in the chest. 3. Three-vessel coronary atherosclerosis. 4.  Aortic Atherosclerosis (ICD10-I70.0).    PET Scan 12/31/2023: IMPRESSION: Left lower lobe lung nodules again seen and relatively similar in size to the previous CT scan. This also has minimal uptake. There are however prominent vessels surrounding the lesion. Differential would include a vascular lesion versus a non hyperbolic lung mass. Would recommend further workup with a CTA of the chest to exclude a vascular lesion otherwise would recommend simple follow up surveillance in 6 months. - Abnormal uptake along the peripheral zone of the prostate on the left side. Please correlate with the patient's PSA and other history. The prostate itself is overall enlarged and there is trabeculated wall thickening of the urinary bladder.     EKG:  EKG 06/03/2024: Sinus bradycardia at 52 bpm Low voltage QRS Nonspecific T wave abnormality Confirmed by Darliss Redell ([id number]) on 06/03/2024 11:35:35 AM  EKG 05/26/2024: Sinus rhythm with premature ventricular or aberrantly conducted complexes Sinus bradycardia Minimal voltage criteria for LVH, may be normal variant ( Cornell product ) Septal infarct , age undetermined Abnormal ECG When compared with ECG of 15-Mar-2018 03:07, rate change noted from ~ Stach to [employer] Confirmed by Anner Lenis [phone]) on 05/26/2024 8:30:39 PM     CV: Long term monitor 06/08/2024 - 06/22/2024: Conclusion Average heart rate 59, range 27-194 bpm. 2 episodes of VT  lasting 5 beats. 53 episodes of paroxysmal SVT.  Longest lasting 7 beats. 9 pauses occurred, longest lasting for 4.2 seconds (around 6 and 8AM, unsure if this was associated with sleep). Frequent PACs 5.3% burden, occasional PVCs 1.7% burden. No atrial fibrillation or atrial flutter.    Echo 05/12/2015: Study Conclusions  - Left ventricle: The cavity size was normal. Wall thickness was    normal. Systolic function was normal. The estimated ejection    fraction was in the range of 50% to 55%. Wall motion was normal;    there were no regional  wall motion abnormalities. Left    ventricular diastolic function parameters were normal.  - Left atrium: The atrium was mildly dilated.  - Comparison: 05/19/2012: LVEF 35-40%, global LV hypokinesis, mild LVH, diastolic dysfunction; 12/17/2012 LVEF 55%, no RWMA, no ASD or PFO     Nuclear stress test 05/26/2012: IMPRESSION:  Abnormal exercise Myoview.  There were no diagnostic ST-segment  abnormalities.  The patient did not have clear evidence of  chronotropic incompetence with adequate heart rate response.  He  did have bursts of what appears to be PSVT and occasional to  frequent ventricular ectopy.  Perfusion imaging shows evidence of  scar versus attenuation affecting a small portion of the  anteroseptum at the apex at a moderate sized inferior defect.  No  large ischemic zones are noted and the ejection fraction was 37%  with global hypokinesis.      Holter Monitor 05/26/2012: IMPRESSION:  Unremarkable continuous electrocardiographic recording with no significant arrhythmias, but a relatively slow average heart rate of 56 minutes bpm.  The lowest recorded rate was 40 beats per minute. There was virtually no sinus tachycardia.    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 10 year screening   Right hand surgery  2011   Abscess drainage     MEDICATIONS:  amLODipine  (NORVASC ) 5 MG tablet   chlordiazePOXIDE  (LIBRIUM ) 25 MG capsule   mirtazapine (REMERON) 15 MG tablet   naproxen  (NAPROSYN ) 500 MG tablet   omeprazole (PRILOSEC) 20 MG capsule    Isaiah Ruder, PA-C Surgical Short Stay/Anesthesiology [facility] Phone [phone] [facility] Phone [phone] 08/02/2024 6:12 PM

## 2024-08-02 NOTE — Progress Notes (Signed)
 Number for patient in chart did not go through. I called his mother to verify number. Mother says she will have the patient call me back.

## 2024-08-04 ENCOUNTER — Other Ambulatory Visit: Payer: Self-pay

## 2024-08-04 ENCOUNTER — Encounter (HOSPITAL_COMMUNITY): Payer: Self-pay | Admitting: Pulmonary Disease

## 2024-08-04 NOTE — Progress Notes (Addendum)
 SDW CALL  Patient was given pre-op instructions over the phone. The opportunity was given for the patient to ask questions. No further questions asked. Patient verbalized understanding of instructions given.   PCP - Nsumanganyi, Raina Elizabeth, NP  Cardiologist - Redell Agbor-Etang,MD  PPM/ICD - denies Device Orders -  Rep Notified -   Chest x-ray - CT-05/24/24 EKG - 06/03/24 Stress Test - 05/26/12 ECHO - 05/02/15 Cardiac Cath - denies  Sleep Study - denies CPAP -   Fasting Blood Sugar - na Checks Blood Sugar _____ times a day  Blood Thinner Instructions:na Aspirin  Instructions:na  ERAS Protcol -NPO PRE-SURGERY Ensure or G2-   COVID TEST- na   Anesthesia review: yes-hx PSVT,HTN,ETOH,cardiomyopathy. Patient had a shot of solu medrol today for an allergic reaction to unknown substance. Face is puffy,skin peeling.  PCP told him to get Cerave. Patient stated he has no problems breathing or swallowing. Informed Allison Zelenak PA-C. Also advised patied to call Dr.Alghanim's office and notify the doctor.  Patient denies shortness of breath, fever, cough and chest pain over the phone call   Oral Hygiene is also important to reduce your risk of infection.  Remember - BRUSH YOUR TEETH THE MORNING OF SURGERY WITH YOUR REGULAR TOOTHPASTE

## 2024-08-05 ENCOUNTER — Telehealth: Payer: Self-pay | Admitting: Pulmonary Disease

## 2024-08-05 ENCOUNTER — Encounter: Payer: Self-pay | Admitting: Pulmonary Disease

## 2024-08-05 DIAGNOSIS — R911 Solitary pulmonary nodule: Secondary | ICD-10-CM | POA: Insufficient documentation

## 2024-08-05 NOTE — Telephone Encounter (Signed)
 Spoke with Gwyn Austin (daughter on DPR) regarding the new appointment date, time and location for the rescheduled Bronchoscopy at [facility] time is 9:40 am Entrance A for 12:10 pm procedure---follow up with Dr. Catherine is 09/07/24 at 1:45 pm.  Will mail information to patient.  I asked to verify patient's phone number and Gwyn states she does not have her father's correct phone number because he gets a new phone number at the drop of a hat.

## 2024-08-05 NOTE — Telephone Encounter (Signed)
 Spoke to Daughter DPR. She is aware of the time change for Bronchoscopy procedure. Patient is scheduled for a Bronch at 11:30am he needs to be there at 9:00am.

## 2024-08-05 NOTE — Telephone Encounter (Signed)
 Spoke with Gwyn Austin (daughter on [place]) regarding new date

## 2024-08-08 NOTE — Telephone Encounter (Signed)
 Copied from CRM [id number]. Topic: Clinical - Medication Question >> Aug 04, 2024  3:58 PM Rozanna MATSU wrote: Reason for CRM: pt wants to know if the injection he got today (solumedrol ) will have any effect on him having his procedure tomorrow, call daughter Gwyn [phone]  Atc to call all numbers pt has on file and none where successful - Dr Catherine stated its fine that he had that injection before procedure but as I was informed pt did not go to procedure believe this has been rescd

## 2024-08-09 ENCOUNTER — Telehealth: Payer: Self-pay

## 2024-08-09 NOTE — Telephone Encounter (Signed)
 Copied from CRM [id number]. Topic: General - Other >> Aug 05, 2024  8:36 AM Ismael A wrote: Reason for CRM: patient's daughter Gwyn called in to try to reschedule bronchoscopy   Ph# [phone]  Bronch has been rescd right?

## 2024-08-12 NOTE — Progress Notes (Signed)
 SDW call attempt.  11 attempts to reach patient. Detailed VM left on daughter phone with arrival time of 0900, NPO, meds in the morning, proper hygiene.  Unable to verify, ht/wt, medical/surgical history, allergies, medications etc.

## 2024-08-15 ENCOUNTER — Encounter (HOSPITAL_COMMUNITY): Payer: Self-pay

## 2024-08-15 ENCOUNTER — Ambulatory Visit (HOSPITAL_COMMUNITY)

## 2024-08-15 ENCOUNTER — Ambulatory Visit (HOSPITAL_COMMUNITY): Admission: RE | Admit: 2024-08-15 | Discharge: 2024-08-15 | Disposition: A | Discharge status: Home / Self Care

## 2024-08-15 ENCOUNTER — Ambulatory Visit (HOSPITAL_COMMUNITY): Payer: Self-pay | Admitting: Physician Assistant

## 2024-08-15 ENCOUNTER — Encounter (HOSPITAL_COMMUNITY): Admission: RE | Disposition: A | Discharge status: Home / Self Care | Payer: Self-pay | Source: Home / Self Care

## 2024-08-15 ENCOUNTER — Ambulatory Visit: Payer: Self-pay | Admitting: Pulmonary Disease

## 2024-08-15 DIAGNOSIS — K219 Gastro-esophageal reflux disease without esophagitis: Secondary | ICD-10-CM | POA: Insufficient documentation

## 2024-08-15 DIAGNOSIS — R911 Solitary pulmonary nodule: Secondary | ICD-10-CM | POA: Diagnosis present

## 2024-08-15 DIAGNOSIS — I10 Essential (primary) hypertension: Secondary | ICD-10-CM

## 2024-08-15 DIAGNOSIS — Z681 Body mass index (BMI) 19 or less, adult: Secondary | ICD-10-CM | POA: Diagnosis not present

## 2024-08-15 DIAGNOSIS — I429 Cardiomyopathy, unspecified: Secondary | ICD-10-CM | POA: Insufficient documentation

## 2024-08-15 DIAGNOSIS — F17200 Nicotine dependence, unspecified, uncomplicated: Secondary | ICD-10-CM | POA: Diagnosis not present

## 2024-08-15 DIAGNOSIS — C3432 Malignant neoplasm of lower lobe, left bronchus or lung: Secondary | ICD-10-CM | POA: Insufficient documentation

## 2024-08-15 DIAGNOSIS — I119 Hypertensive heart disease without heart failure: Secondary | ICD-10-CM | POA: Insufficient documentation

## 2024-08-15 DIAGNOSIS — I471 Supraventricular tachycardia, unspecified: Secondary | ICD-10-CM | POA: Insufficient documentation

## 2024-08-15 DIAGNOSIS — F17210 Nicotine dependence, cigarettes, uncomplicated: Secondary | ICD-10-CM

## 2024-08-15 DIAGNOSIS — R634 Abnormal weight loss: Secondary | ICD-10-CM | POA: Insufficient documentation

## 2024-08-15 DIAGNOSIS — B182 Chronic viral hepatitis C: Secondary | ICD-10-CM | POA: Insufficient documentation

## 2024-08-15 HISTORY — PX: VIDEO BRONCHOSCOPY WITH ENDOBRONCHIAL NAVIGATION: SHX6175

## 2024-08-15 HISTORY — PX: CRYOTHERAPY: SHX6894

## 2024-08-15 HISTORY — PX: BRONCHIAL NEEDLE ASPIRATION BIOPSY: SHX5106

## 2024-08-15 LAB — COMPREHENSIVE METABOLIC PANEL WITH GFR
ALT: 28 U/L (ref 0–44)
AST: 46 U/L — ABNORMAL HIGH (ref 15–41)
Albumin: 4 g/dL (ref 3.5–5.0)
Alkaline Phosphatase: 114 U/L (ref 38–126)
Anion gap: 13 (ref 5–15)
BUN: 8 mg/dL (ref 8–23)
CO2: 23 mmol/L (ref 22–32)
Calcium: 9.2 mg/dL (ref 8.9–10.3)
Chloride: 104 mmol/L (ref 98–111)
Creatinine, Ser: 0.74 mg/dL (ref 0.61–1.24)
GFR, Estimated: >60 mL/min
Glucose, Bld: 79 mg/dL (ref 70–99)
Potassium: 4.3 mmol/L (ref 3.5–5.1)
Sodium: 140 mmol/L (ref 135–145)
Total Bilirubin: 0.5 mg/dL (ref 0.0–1.2)
Total Protein: 7.3 g/dL (ref 6.5–8.1)

## 2024-08-15 LAB — CBC
HCT: 46 % (ref 39.0–52.0)
Hemoglobin: 16.1 g/dL (ref 13.0–17.0)
MCH: 35.1 pg — ABNORMAL HIGH (ref 26.0–34.0)
MCHC: 35 g/dL (ref 30.0–36.0)
MCV: 100.2 fL — ABNORMAL HIGH (ref 80.0–100.0)
Platelets: 254 K/uL (ref 150–400)
RBC: 4.59 MIL/uL (ref 4.22–5.81)
RDW: 12.7 % (ref 11.5–15.5)
WBC: 5 K/uL (ref 4.0–10.5)
nRBC: 0 % (ref 0.0–0.2)

## 2024-08-15 MED ORDER — PHENYLEPHRINE HCL-NACL 20-0.9 MG/250ML-% IV SOLN
INTRAVENOUS | Status: DC | PRN
  Administered 2024-08-15: 20 ug/min via INTRAVENOUS

## 2024-08-15 MED ORDER — PROPOFOL 10 MG/ML IV BOLUS
INTRAVENOUS | Status: DC | PRN
  Administered 2024-08-15: 100 mg via INTRAVENOUS

## 2024-08-15 MED ORDER — ONDANSETRON HCL 4 MG/2ML IJ SOLN
INTRAMUSCULAR | Status: DC | PRN
  Administered 2024-08-15: 4 mg via INTRAVENOUS

## 2024-08-15 MED ORDER — LACTATED RINGERS IV SOLN: INTRAVENOUS | Status: DC

## 2024-08-15 MED ORDER — PHENYLEPHRINE 80 MCG/ML (10ML) SYRINGE FOR IV PUSH (FOR BLOOD PRESSURE SUPPORT)
PREFILLED_SYRINGE | INTRAVENOUS | Status: DC | PRN
  Administered 2024-08-15 (×2): 80 ug via INTRAVENOUS

## 2024-08-15 MED ORDER — FENTANYL CITRATE (PF) 100 MCG/2ML IJ SOLN
INTRAMUSCULAR | Status: AC
  Filled 2024-08-15: qty 2

## 2024-08-15 MED ORDER — ACETAMINOPHEN 500 MG PO TABS
1000.0000 mg | ORAL_TABLET | Freq: Once | ORAL | Status: AC
  Administered 2024-08-15: 1000 mg via ORAL
  Filled 2024-08-15: qty 2

## 2024-08-15 MED ORDER — ROCURONIUM BROMIDE 10 MG/ML (PF) SYRINGE
PREFILLED_SYRINGE | INTRAVENOUS | Status: DC | PRN
  Administered 2024-08-15: 20 mg via INTRAVENOUS
  Administered 2024-08-15: 50 mg via INTRAVENOUS

## 2024-08-15 MED ORDER — DEXAMETHASONE SOD PHOSPHATE PF 10 MG/ML IJ SOLN
INTRAMUSCULAR | Status: DC | PRN
  Administered 2024-08-15: 5 mg via INTRAVENOUS

## 2024-08-15 MED ORDER — EPHEDRINE SULFATE-NACL 50-0.9 MG/10ML-% IV SOSY
PREFILLED_SYRINGE | INTRAVENOUS | Status: DC | PRN
  Administered 2024-08-15: 5 mg via INTRAVENOUS

## 2024-08-15 MED ORDER — SUGAMMADEX SODIUM 200 MG/2ML IV SOLN
INTRAVENOUS | Status: DC | PRN
  Administered 2024-08-15: 200 mg via INTRAVENOUS

## 2024-08-15 MED ORDER — CHLORHEXIDINE GLUCONATE 0.12 % MT SOLN
15.0000 mL | Freq: Once | OROMUCOSAL | Status: AC
  Administered 2024-08-15: 15 mL via OROMUCOSAL
  Filled 2024-08-15: qty 15

## 2024-08-15 MED ORDER — FENTANYL CITRATE (PF) 100 MCG/2ML IJ SOLN
INTRAMUSCULAR | Status: DC | PRN
  Administered 2024-08-15 (×2): 50 ug via INTRAVENOUS

## 2024-08-15 MED ORDER — LIDOCAINE HCL (CARDIAC) PF 100 MG/5ML IV SOSY
PREFILLED_SYRINGE | INTRAVENOUS | Status: DC | PRN
  Administered 2024-08-15: 80 mg via INTRAVENOUS

## 2024-08-15 NOTE — Progress Notes (Signed)
 Post bronchoscopy xray ready as may be small left apical pneumothorax. Pt is ready and is wanting to go home. Remains asymptomatic, denies chest pain, shortness of breath.  Will order a repeat Xray and if stable/improved pt can go home. He is aware that if he develops new shortness of breath, chest pain, he will have to come back to ED

## 2024-08-15 NOTE — H&P (Signed)
 HPI: Christopher Austin is a 69 year old male who presents for evaluation of a left lower lobe lung nodule.   A left lower lobe lung nodule measuring 1.7 by 1.7 centimeters was identified during a lung cancer screening CT scan in November 2024. A subsequent PET scan showed slight uptake in the area. He has experienced unintentional weight loss. He has a significant smoking history, having smoked since age 59   O/E: Thin frail man B/l breath sounds No edema S1.S2, M0 Pt on room air  Images- PET and CT reviewed Pulm notes reviewed  A/P: Solitary pulmonary nodule, left lower lobe  Tobacco abuse   Proceed with navigational bronchoscopic bx. I explained the bronchoscopic biopsy procedure at depth.  We discussed about other potential options including continued follow up, CT guided biopsy, surgical biopsy. Risks and benefits of bronchoscopy +/- biopsy discussed with the patient and his family in details and all the questions were answered. They understand the risk of bleeding, pneumothorax, injury to blood vessels and would like to proceed for the bronchoscopy with biopsy.

## 2024-08-15 NOTE — Procedures (Signed)
 Video Bronchoscopy with Robotic Assisted Bronchoscopic Navigation   Date of Operation: 08/15/2024   Pre-op Diagnosis: left lower nodule  Post-op Diagnosis: left lung nodule  Surgeon: Pleas, MD  Assistants: GA  Anesthesia: General endotracheal anesthesia  Operation: Flexible video fiberoptic bronchoscopy with robotic assistance and biopsies.  Estimated Blood Loss: Minimal  Complications: None  Indications and History: Christopher Austin is a 69 y.o. male with history of enlarging left lower nodule.  Recommendation made to achieve a tissue diagnosis via robotic assisted navigational bronchoscopy.  The risks, benefits, complications, treatment options and expected outcomes were discussed with the patient.  The possibilities of pneumothorax, pneumonia, reaction to medication, pulmonary aspiration, perforation of a viscus, bleeding, failure to diagnose a condition and creating a complication requiring transfusion or operation were discussed with the patient who freely signed the consent.    Description of Procedure: The patient was seen in the Preoperative Area, was examined and was deemed appropriate to proceed.  The patient was taken to [facility] Endoscopy room 3, identified as [name] and the procedure verified as Flexible Video Fiberoptic Bronchoscopy.  A Time Out was held and the above information confirmed.   Prior to the date of the procedure a high-resolution CT scan of the chest was performed. Utilizing ION software program a virtual tracheobronchial tree was generated to allow the creation of distinct navigation pathways to the patient's parenchymal abnormalities. After being taken to the operating room general anesthesia was initiated and the patient  was orally intubated. The video fiberoptic bronchoscope was introduced via the endotracheal tube and a general inspection was performed which showed normal right and left lung anatomy. Aspiration of the bilateral mainstems was  completed to remove any remaining secretions. Robotic catheter inserted into patient's endotracheal tube.   Target #1 left lower lobe nodule: The distinct navigation pathways prepared prior to this procedure were then utilized to navigate to patient's lesion identified on CT scan. The robotic catheter was secured into place and the vision probe was withdrawn.  Lesion location was confirmed using radial probe. Concentric view of the lesion was seen on radial ultrasound. Under fluoroscopic guidance, transbronchial needle aspiration, biopsy and cyro biopsies were performed   At the end of the procedure a general airway inspection was performed and there was no evidence of active bleeding. The bronchoscope was removed.  The patient tolerated the procedure well. There was no significant blood loss and there were no obvious complications. A post-procedural chest x-ray is pending.  Samples Target left lower lobe nodule 1. Transbronchial Wang needle biopsies from left lower lobe - 7 passes 2. Transbronchial forceps biopsies 4 passes 3. Transbronchial cryo-biopsies were performed - 5 passes 4. Bronchoalveolar lavage from left lower lobe     Plans:  The patient will be discharged from the PACU to home when recovered from anesthesia and after chest x-ray is reviewed. We will review the cytology, pathology  results with the patient when they become available. Outpatient followup will be with Dr Catherine.

## 2024-08-15 NOTE — Anesthesia Procedure Notes (Signed)
 Procedure Name: Intubation Date/Time: 08/15/2024 11:51 AM  Performed by: Cindie Donald CROME, CRNAPre-anesthesia Checklist: Patient identified, Emergency Drugs available, Suction available and Patient being monitored Patient Re-evaluated:Patient Re-evaluated prior to induction Oxygen  Delivery Method: Circle System Utilized Preoxygenation: Pre-oxygenation with 100% oxygen  Induction Type: IV induction Ventilation: Mask ventilation without difficulty Laryngoscope Size: Mac and 4 Grade View: Grade I Tube type: Oral Tube size: 8.5 mm Number of attempts: 1 Airway Equipment and Method: Stylet Placement Confirmation: ETT inserted through vocal cords under direct vision, positive ETCO2 and breath sounds checked- equal and bilateral Secured at: 21 cm Tube secured with: Tape Dental Injury: Teeth and Oropharynx as per pre-operative assessment

## 2024-08-15 NOTE — Transfer of Care (Signed)
 Immediate Anesthesia Transfer of Care Note  Patient: Christopher Austin  Procedure(s) Performed: VIDEO BRONCHOSCOPY WITH ENDOBRONCHIAL NAVIGATION (Left) CRYOTHERAPY BRONCHOSCOPY, WITH NEEDLE ASPIRATION BIOPSY  Patient Location: PACU  Anesthesia Type:General  Level of Consciousness: awake, alert , oriented, and patient cooperative  Airway & Oxygen  Therapy: Patient Spontanous Breathing and Patient connected to nasal cannula oxygen   Post-op Assessment: Report given to RN and Post -op Vital signs reviewed and stable  Post vital signs: Reviewed and stable  Last Vitals:  Vitals Value Taken Time  BP 123/68 08/15/24 13:02  Temp    Pulse 60 08/15/24 13:05  Resp 17 08/15/24 13:05  SpO2 96 % 08/15/24 13:05  Vitals shown include unfiled device data.  Last Pain:  Vitals:   08/15/24 0937  TempSrc:   PainSc: 0-No pain         Complications: No notable events documented.

## 2024-08-15 NOTE — Discharge Instructions (Signed)

## 2024-08-16 ENCOUNTER — Telehealth: Payer: Self-pay | Admitting: Pulmonary Disease

## 2024-08-16 DIAGNOSIS — C3492 Malignant neoplasm of unspecified part of left bronchus or lung: Secondary | ICD-10-CM

## 2024-08-16 LAB — CYTOLOGY - NON PAP

## 2024-08-16 NOTE — Anesthesia Postprocedure Evaluation (Signed)
"   Anesthesia Post Note  Patient: Christopher Austin  Procedure(s) Performed: VIDEO BRONCHOSCOPY WITH ENDOBRONCHIAL NAVIGATION (Left) CRYOTHERAPY BRONCHOSCOPY, WITH NEEDLE ASPIRATION BIOPSY     Patient location during evaluation: PACU Anesthesia Type: General Level of consciousness: awake and alert Pain management: pain level controlled Vital Signs Assessment: post-procedure vital signs reviewed and stable Respiratory status: spontaneous breathing, nonlabored ventilation and respiratory function stable Cardiovascular status: blood pressure returned to baseline and stable Postop Assessment: no apparent nausea or vomiting Anesthetic complications: no   No notable events documented.  Last Vitals:  Vitals:   08/15/24 1415 08/15/24 1430  BP: (!) 100/59 105/63  Pulse: 65 (!) 41  Resp: 16 16  Temp:  36.9 C  SpO2: 93% 93%    Last Pain:  Vitals:   08/15/24 1415  TempSrc:   PainSc: 0-No pain                 Garnette FORBES Skillern      "

## 2024-08-16 NOTE — Telephone Encounter (Signed)
 Called patient and informed him that nodule is lung cancer. Likely stage I. Will send referral to thoracic surgery and radiation oncology. Patient prefers radiation but I told him to meet with thoracic surgery. PFTs are not bad for surgery.

## 2024-08-16 NOTE — Telephone Encounter (Signed)
 Spoke with patient regarding new appointment date and time for follow up with Dr. Gerline 08/31/24 at 9:45 am---arrival time is 9:30 am for check in---patient was given address and phone number byt will also mail information to him.  He voiced his understanding

## 2024-08-17 ENCOUNTER — Ambulatory Visit: Admitting: Acute Care

## 2024-08-17 ENCOUNTER — Encounter (HOSPITAL_COMMUNITY): Payer: Self-pay

## 2024-08-25 ENCOUNTER — Other Ambulatory Visit: Payer: Self-pay

## 2024-08-25 NOTE — Progress Notes (Signed)
 The proposed treatment discussed in conference is for discussion purpose only and is not a binding recommendation.  The patients have not been physically examined, or presented with their treatment options.  Therefore, final treatment plans cannot be decided.

## 2024-08-26 ENCOUNTER — Telehealth: Payer: Self-pay | Admitting: Acute Care

## 2024-08-26 ENCOUNTER — Encounter: Admitting: Thoracic Surgery (Cardiothoracic Vascular Surgery)

## 2024-08-26 NOTE — Telephone Encounter (Signed)
 Per the thoracic conference recommendation , this patient needs to have PFT's and thoracic surgery referral. Just wanted to make sure you were both aware. Dr. Catherine, he is seeing you 2/4. Thanks

## 2024-08-27 NOTE — Telephone Encounter (Signed)
 Thanks for update. Already referred to cardiothoracic surgery and PFTs done on 09/2023. I think would be a good candidate for surgery.

## 2024-08-31 ENCOUNTER — Encounter: Payer: Self-pay | Admitting: Pulmonary Disease

## 2024-08-31 ENCOUNTER — Ambulatory Visit (HOSPITAL_BASED_OUTPATIENT_CLINIC_OR_DEPARTMENT_OTHER): Admitting: Pulmonary Disease

## 2024-09-05 ENCOUNTER — Ambulatory Visit: Admitting: Physician Assistant

## 2024-09-07 ENCOUNTER — Ambulatory Visit: Admitting: Pulmonary Disease
